# Patient Record
Sex: Male | Born: 1965 | Race: White | Hispanic: No | Marital: Married | Smoking: Current every day smoker
Health system: Southern US, Community
[De-identification: ages and names within clinical notes are randomized; demographics above are authoritative.]

## PROBLEM LIST (undated history)

## (undated) DIAGNOSIS — N200 Calculus of kidney: Secondary | ICD-10-CM

## (undated) DIAGNOSIS — E785 Hyperlipidemia, unspecified: Secondary | ICD-10-CM

## (undated) DIAGNOSIS — R5383 Other fatigue: Secondary | ICD-10-CM

## (undated) DIAGNOSIS — Z720 Tobacco use: Secondary | ICD-10-CM

## (undated) DIAGNOSIS — T7840XA Allergy, unspecified, initial encounter: Secondary | ICD-10-CM

## (undated) DIAGNOSIS — K08109 Complete loss of teeth, unspecified cause, unspecified class: Secondary | ICD-10-CM

## (undated) DIAGNOSIS — R300 Dysuria: Secondary | ICD-10-CM

## (undated) DIAGNOSIS — D649 Anemia, unspecified: Secondary | ICD-10-CM

## (undated) DIAGNOSIS — F419 Anxiety disorder, unspecified: Secondary | ICD-10-CM

## (undated) DIAGNOSIS — D72825 Bandemia: Secondary | ICD-10-CM

## (undated) DIAGNOSIS — R319 Hematuria, unspecified: Secondary | ICD-10-CM

## (undated) DIAGNOSIS — G44209 Tension-type headache, unspecified, not intractable: Secondary | ICD-10-CM

## (undated) DIAGNOSIS — Z8619 Personal history of other infectious and parasitic diseases: Secondary | ICD-10-CM

## (undated) DIAGNOSIS — R5381 Other malaise: Secondary | ICD-10-CM

## (undated) DIAGNOSIS — Z973 Presence of spectacles and contact lenses: Secondary | ICD-10-CM

## (undated) DIAGNOSIS — F329 Major depressive disorder, single episode, unspecified: Secondary | ICD-10-CM

## (undated) DIAGNOSIS — Z87442 Personal history of urinary calculi: Secondary | ICD-10-CM

## (undated) DIAGNOSIS — M5136 Other intervertebral disc degeneration, lumbar region: Secondary | ICD-10-CM

## (undated) DIAGNOSIS — I10 Essential (primary) hypertension: Secondary | ICD-10-CM

## (undated) DIAGNOSIS — C679 Malignant neoplasm of bladder, unspecified: Secondary | ICD-10-CM

## (undated) DIAGNOSIS — M51369 Other intervertebral disc degeneration, lumbar region without mention of lumbar back pain or lower extremity pain: Secondary | ICD-10-CM

## (undated) HISTORY — PX: KNEE SURGERY: SHX244

## (undated) HISTORY — DX: Personal history of other infectious and parasitic diseases: Z86.19

## (undated) HISTORY — DX: Allergy, unspecified, initial encounter: T78.40XA

## (undated) HISTORY — DX: Anemia, unspecified: D64.9

## (undated) HISTORY — DX: Anxiety disorder, unspecified: F41.9

## (undated) HISTORY — DX: Other fatigue: R53.83

## (undated) HISTORY — PX: VASECTOMY: SHX75

## (undated) HISTORY — DX: Tobacco use: Z72.0

## (undated) HISTORY — DX: Other malaise: R53.81

## (undated) HISTORY — DX: Calculus of kidney: N20.0

## (undated) HISTORY — PX: TYMPANOSTOMY TUBE PLACEMENT: SHX32

## (undated) HISTORY — DX: Hyperlipidemia, unspecified: E78.5

## (undated) HISTORY — DX: Major depressive disorder, single episode, unspecified: F32.9

## (undated) HISTORY — PX: TONSILLECTOMY: SUR1361

---
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
## 2004-11-17 ENCOUNTER — Ambulatory Visit: Payer: Self-pay | Admitting: Family Medicine

## 2005-10-07 ENCOUNTER — Emergency Department (HOSPITAL_COMMUNITY): Admission: EM | Admit: 2005-10-07 | Discharge: 2005-10-07 | Payer: Self-pay | Admitting: *Deleted

## 2006-09-14 ENCOUNTER — Ambulatory Visit: Payer: Self-pay | Admitting: Family Medicine

## 2007-11-14 ENCOUNTER — Telehealth: Payer: Self-pay | Admitting: Family Medicine

## 2007-12-13 ENCOUNTER — Ambulatory Visit: Payer: Self-pay | Admitting: Family Medicine

## 2007-12-13 LAB — CONVERTED CEMR LAB
Bilirubin Urine: NEGATIVE
Nitrite: NEGATIVE
Urobilinogen, UA: 0.2
WBC Urine, dipstick: NEGATIVE

## 2007-12-21 ENCOUNTER — Ambulatory Visit: Payer: Self-pay | Admitting: Family Medicine

## 2007-12-21 DIAGNOSIS — M129 Arthropathy, unspecified: Secondary | ICD-10-CM | POA: Insufficient documentation

## 2007-12-21 DIAGNOSIS — J309 Allergic rhinitis, unspecified: Secondary | ICD-10-CM | POA: Insufficient documentation

## 2007-12-21 LAB — CONVERTED CEMR LAB
ALT: 23 units/L (ref 0–53)
Albumin: 4.5 g/dL (ref 3.5–5.2)
Alkaline Phosphatase: 73 units/L (ref 39–117)
BUN: 19 mg/dL (ref 6–23)
CO2: 32 meq/L (ref 19–32)
Calcium: 10 mg/dL (ref 8.4–10.5)
Eosinophils Relative: 5.2 % — ABNORMAL HIGH (ref 0.0–5.0)
GFR calc Af Amer: 106 mL/min
Glucose, Bld: 93 mg/dL (ref 70–99)
HCT: 38.1 % — ABNORMAL LOW (ref 39.0–52.0)
Hemoglobin: 12.8 g/dL — ABNORMAL LOW (ref 13.0–17.0)
Lymphocytes Relative: 60.8 % — ABNORMAL HIGH (ref 12.0–46.0)
Monocytes Absolute: 0.5 10*3/uL (ref 0.1–1.0)
Monocytes Relative: 7.6 % (ref 3.0–12.0)
Neutro Abs: 1.8 10*3/uL (ref 1.4–7.7)
RBC: 4.12 M/uL — ABNORMAL LOW (ref 4.22–5.81)
Total CHOL/HDL Ratio: 3.5
Total Protein: 7.3 g/dL (ref 6.0–8.3)
WBC: 7 10*3/uL (ref 4.5–10.5)

## 2008-05-28 ENCOUNTER — Telehealth: Payer: Self-pay | Admitting: Family Medicine

## 2008-06-11 ENCOUNTER — Telehealth: Payer: Self-pay | Admitting: Family Medicine

## 2008-09-17 ENCOUNTER — Telehealth: Payer: Self-pay | Admitting: Family Medicine

## 2008-09-25 ENCOUNTER — Ambulatory Visit: Payer: Self-pay | Admitting: Family Medicine

## 2008-10-31 ENCOUNTER — Telehealth: Payer: Self-pay | Admitting: Family Medicine

## 2008-12-24 ENCOUNTER — Telehealth (INDEPENDENT_AMBULATORY_CARE_PROVIDER_SITE_OTHER): Payer: Self-pay | Admitting: *Deleted

## 2009-01-07 ENCOUNTER — Telehealth: Payer: Self-pay | Admitting: Family Medicine

## 2009-02-13 ENCOUNTER — Ambulatory Visit: Payer: Self-pay | Admitting: Family Medicine

## 2009-02-13 DIAGNOSIS — F419 Anxiety disorder, unspecified: Secondary | ICD-10-CM

## 2009-02-13 DIAGNOSIS — F329 Major depressive disorder, single episode, unspecified: Secondary | ICD-10-CM

## 2009-02-13 DIAGNOSIS — G43909 Migraine, unspecified, not intractable, without status migrainosus: Secondary | ICD-10-CM | POA: Insufficient documentation

## 2009-02-13 HISTORY — DX: Anxiety disorder, unspecified: F41.9

## 2009-09-10 ENCOUNTER — Ambulatory Visit: Payer: Self-pay | Admitting: Family Medicine

## 2009-09-10 DIAGNOSIS — G44209 Tension-type headache, unspecified, not intractable: Secondary | ICD-10-CM

## 2010-02-05 ENCOUNTER — Telehealth: Payer: Self-pay | Admitting: Family Medicine

## 2010-07-20 ENCOUNTER — Telehealth: Payer: Self-pay | Admitting: Family Medicine

## 2010-08-12 ENCOUNTER — Telehealth: Payer: Self-pay | Admitting: Family Medicine

## 2010-09-29 NOTE — Progress Notes (Signed)
Summary: refill alprazolam 30 day supply   Phone Note Call from Patient   Caller: Patient Call For: Judithann Sheen MD Summary of Call[facility] 30 days to be called in.  Cannot afford a 90 day.  Initial call taken by: Lynann Beaver CMA,  February 05, 2010 11:30 AM  Follow-up for Phone Call        ok per dr Alfonzo Feller  Follow-up by: Pura Spice, RN,  February 05, 2010 1:11 PM    New/Updated Medications: ALPRAZOLAM 0.5 MG  TABS (ALPRAZOLAM) three times a day as needed ANXIETY Prescriptions: ALPRAZOLAM 0.5 MG  TABS (ALPRAZOLAM) three times a day as needed ANXIETY  #90 x 4   Entered by:   Pura Spice, RN   Authorized by:   Judithann Sheen MD   Signed by:   Pura Spice, RN on 02/05/2010   Method used:   Telephoned to ...       [facility]  [place] [address] (retail)       [address]       [place], [place]  [address]       Ph: [phone] or [phone]       Fax: [phone]   RxID:   [id number]

## 2010-09-29 NOTE — Assessment & Plan Note (Signed)
Summary: FUP ON MEDS-CONGESTION//CCM   Vital Signs:  Patient profile:   45 year old male Weight:      147 pounds BMI:     21.79 O2 Sat:      98 % Temp:     98 degrees F Pulse rate:   100 / minute BP sitting:   134 / 82  (left arm)  Vitals Entered By: Pura Spice, RN (September 10, 2009 3:32 PM) CC: renew meds  Is Patient Diabetic? No Pain Assessment Patient in pain? no        History of Present Illness: This 45 year old white male was in to have his medications refilled for his medical problems. He has a problem with stress and tension resulting in some tension headaches of which have decreased in frequency. Patient relates he is trying to decrease his smoking and promises about [place] Fus comes in for his physical in 6 months they will quit. He relates his working hard and under stress Sleeps for 5 hours discussed taken one half alprazolam at 20 clock a.m. if needed relates he has not had a migraine headache in the past 6 months and does not need Relpax refill  Allergies (verified): No Known Drug Allergies  Past History:  Past Medical History: Last updated: 12/21/2007 Allergic rhinitis Headache  Past Surgical History: Last updated: 12/21/2007 Tonsillectomy  Risk Factors: Smoking Status: current (12/21/2007) Packs/Day: 1 (12/21/2007)  Review of Systems  The patient denies anorexia, fever, weight loss, weight gain, vision loss, decreased hearing, hoarseness, chest pain, syncope, dyspnea on exertion, peripheral edema, prolonged cough, headaches, hemoptysis, abdominal pain, melena, hematochezia, severe indigestion/heartburn, hematuria, incontinence, genital sores, muscle weakness, suspicious skin lesions, transient blindness, difficulty walking, depression, unusual weight change, abnormal bleeding, enlarged lymph nodes, angioedema, breast masses, and testicular masses.    Physical Exam  General:  Well-developed,well-nourished,in no acute distress; alert,appropriate and  cooperative throughout examination Lungs:  Normal respiratory effort, chest expands symmetrically. Lungs are clear to auscultation, no crackles or wheezes. Heart:  Normal rate and regular rhythm. S1 and S2 normal without gallop, murmur, click, rub or other extra sounds. Abdomen:  Bowel sounds positive,abdomen soft and non-tender without masses, organomegaly or hernias noted. Extremities:  No clubbing, cyanosis, edema, or deformity noted with normal full range of motion of all joints.     Impression & Recommendations:  Problem # 1:  ANXIETY (ICD-300.00) Assessment Improved  His updated medication list for this problem includes:    Alprazolam 0.5 Mg Tabs (Alprazolam) .Marland Kitchen... Three times a day as needed anxiety  Problem # 2:  MIGRAINE HEADACHE (ICD-346.90) Assessment: Improved  The following medications were removed from the medication list:    Diclofenac Sodium 75 Mg Tbec (Diclofenac sodium) .Marland Kitchen... 1 two times a day for arthritis His updated medication list for this problem includes:    Fioricet 50-325-40 Mg Tabs (Butalbital-apap-caffeine) .Marland Kitchen... Take 2 every 4hrs as needed headache.not to exceed 4 per day    Relpax 40 Mg Tabs (Eletriptan hydrobromide) .Marland Kitchen... 1 onset headach, repeat in 1-2 hrs if needed  Problem # 3:  ARTHRITIS (ICD-716.90) Assessment: Improved  Problem # 4:  HEADACHE, TENSION, CHRONIC (ICD-307.81) Assessment: Unchanged  The following medications were removed from the medication list:    Diclofenac Sodium 75 Mg Tbec (Diclofenac sodium) .Marland Kitchen... 1 two times a day for arthritis His updated medication list for this problem includes:    Fioricet 50-325-40 Mg Tabs (Butalbital-apap-caffeine) .Marland Kitchen... Take 2 every 4hrs as needed headache.not to exceed 4 per day  Relpax 40 Mg Tabs (Eletriptan hydrobromide) .Marland Kitchen... 1 onset headach, repeat in 1-2 hrs if needed  Complete Medication List: 1)  Alprazolam 0.5 Mg Tabs (Alprazolam) .... Three times a day as needed anxiety 2)  Fioricet  50-325-40 Mg Tabs (Butalbital-apap-caffeine) .... Take 2 every 4hrs as needed headache.not to exceed 4 per day 3)  Relpax 40 Mg Tabs (Eletriptan hydrobromide) .Marland Kitchen.. 1 onset headach, repeat in 1-2 hrs if needed  Patient Instructions: 1)  Refill medications 2)  Proud that you have almost stop smoking 3)  return 6 months for physical exam Prescriptions: FIORICET 50-325-40 MG TABS (BUTALBITAL-APAP-CAFFEINE) take 2 every 4hrs as needed headache.not to exceed 4 per day  #300 x 1   Entered and Authorized by:   Judithann Sheen MD   Signed by:   Judithann Sheen MD on 09/10/2009   Method used:   Print then Give to Patient   RxID:   [id number] ALPRAZOLAM 0.5 MG  TABS (ALPRAZOLAM) three times a day as needed ANXIETY  #270 x 1   Entered and Authorized by:   Judithann Sheen MD   Signed by:   Judithann Sheen MD on 09/10/2009   Method used:   Print then Give to Patient   RxID:   [id number]

## 2010-09-29 NOTE — Progress Notes (Signed)
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  Pt has appt with Dr Scotty Court in Jan. 2012.   Initial call taken by: Debbra Riding,  July 20, 2010 12:40 PM  Follow-up for Phone Call        Alprazolam was given on 11/6 with a refill so no more. Can have Fioricet with same sig but only 120 tabs til Dr Scotty Court returns Follow-up by: Danise Edge MD,  July 20, 2010 12:55 PM

## 2010-10-01 NOTE — Progress Notes (Signed)
Summary: refill  Phone Note Refill Request Call back at [phone] Message from:  Patient----triage vm  Refills Requested: Medication #1:  ALPRAZOLAM 0.5 MG  TABS three times a day as needed ANXIETY send to [facility]       phone---[phone]    appt has been made already.  Initial call taken by: Warnell Forester,  August 12, 2010 1:08 PM  Follow-up for Phone Call        Rx called to pharmacy Follow-up by: Alfred Levins, CMA,  August 18, 2010 3:46 PM    Prescriptions: ALPRAZOLAM 0.5 MG  TABS (ALPRAZOLAM) three times a day as needed ANXIETY  #90 x 0   Entered by:   Alfred Levins, CMA   Authorized by:   Judithann Sheen MD   Signed by:   Alfred Levins, CMA on 08/18/2010   Method used:   Telephoned to ...       [facility]  [address] (retail)       [address], [address]       [place], [place]  [address]       Ph: [phone]       Fax: [phone]   RxID:   [id number]

## 2010-10-21 ENCOUNTER — Other Ambulatory Visit: Payer: Self-pay | Admitting: Family Medicine

## 2010-11-03 ENCOUNTER — Encounter: Payer: Self-pay | Admitting: Family Medicine

## 2010-11-04 ENCOUNTER — Encounter: Payer: Self-pay | Admitting: Family Medicine

## 2010-11-04 ENCOUNTER — Telehealth: Payer: Self-pay | Admitting: Family Medicine

## 2010-11-04 ENCOUNTER — Ambulatory Visit (INDEPENDENT_AMBULATORY_CARE_PROVIDER_SITE_OTHER): Payer: Self-pay | Admitting: Family Medicine

## 2010-11-04 VITALS — BP 130/80 | Temp 98.3°F | Ht 69.0 in | Wt 151.0 lb

## 2010-11-04 DIAGNOSIS — F419 Anxiety disorder, unspecified: Secondary | ICD-10-CM

## 2010-11-04 DIAGNOSIS — R51 Headache: Secondary | ICD-10-CM

## 2010-11-04 DIAGNOSIS — R4589 Other symptoms and signs involving emotional state: Secondary | ICD-10-CM

## 2010-11-04 DIAGNOSIS — F341 Dysthymic disorder: Secondary | ICD-10-CM

## 2010-11-04 DIAGNOSIS — F439 Reaction to severe stress, unspecified: Secondary | ICD-10-CM

## 2010-11-04 MED ORDER — ELETRIPTAN HYDROBROMIDE 40 MG PO TABS: 40.0000 mg | ORAL_TABLET | ORAL | Status: DC | PRN

## 2010-11-04 MED ORDER — ALPRAZOLAM 0.5 MG PO TABS: 1.0000 mg | ORAL_TABLET | Freq: Every evening | ORAL | Status: DC | PRN

## 2010-11-04 MED ORDER — BUTALBITAL-APAP-CAFFEINE 50-325-40 MG PO TABS: 2.0000 | ORAL_TABLET | ORAL | Status: DC | PRN

## 2010-11-04 NOTE — Telephone Encounter (Signed)
Call returned by Dr. Scotty Court. He left a message for the pt

## 2010-11-04 NOTE — Telephone Encounter (Signed)
Triage vm------pt has a ? About his Alprazolam. Please return his call.

## 2010-11-05 ENCOUNTER — Ambulatory Visit: Payer: Self-pay | Admitting: Family Medicine

## 2010-11-05 NOTE — Telephone Encounter (Signed)
See note

## 2010-11-10 MED ORDER — ALPRAZOLAM 1 MG PO TABS: 1.0000 mg | ORAL_TABLET | Freq: Four times a day (QID) | ORAL | Status: DC

## 2010-11-11 NOTE — Progress Notes (Signed)
  Subjective:    Patient ID: Dustin Ruiz, male    DOB: 1966/02/15, 45 y.o.   MRN: [id number]  This 45 year old white married male 45 year-old artery was left home over the past year relates he has had a very traumatic year having lost his job, normal and lost his dad his daughter has moved out and all these problems is called him to have increased headache, anxiety and depression and considerable problem with insomnia irrigated into discuss these problems as well as have his medications refilled in addition to the 20 year old daughter he has 2 other children a son 78 and a daughter 66 She has no insurance at this time and would request that we do not do any lab studies today and a CBC as insurance will return for physical examination HPI    Review of Systemssee  History of present illness    Objective:   Physical Exam Patient is a well developed well-nourished male who appears depressed but in no distress physical Blood pressure normal heart lungs clear       Assessment & Plan:  The patient is depressed anxious and needs his medications refilled

## 2010-11-11 NOTE — Patient Instructions (Signed)
I agree you had of her distress past year and I will refill your medication Return for numbness or

## 2011-02-15 ENCOUNTER — Other Ambulatory Visit: Payer: Self-pay | Admitting: Family Medicine

## 2011-02-18 ENCOUNTER — Telehealth: Payer: Self-pay | Admitting: Family Medicine

## 2011-02-18 NOTE — Telephone Encounter (Signed)
Pts wife called re: pts stress headaches. Pls call back asap.

## 2011-02-19 ENCOUNTER — Other Ambulatory Visit: Payer: Self-pay

## 2011-02-19 MED ORDER — HYDROCODONE-ACETAMINOPHEN 10-325 MG PO TABS: 1.0000 | ORAL_TABLET | Freq: Four times a day (QID) | ORAL | Status: AC | PRN

## 2011-02-23 NOTE — Telephone Encounter (Signed)
Dr. Scotty Court called pt and discussed this matter.

## 2011-03-16 ENCOUNTER — Other Ambulatory Visit: Payer: Self-pay | Admitting: Family Medicine

## 2011-04-26 ENCOUNTER — Other Ambulatory Visit: Payer: Self-pay | Admitting: Family Medicine

## 2011-04-29 ENCOUNTER — Telehealth: Payer: Self-pay | Admitting: Family Medicine

## 2011-04-29 MED ORDER — ALPRAZOLAM 1 MG PO TABS: 1.0000 mg | ORAL_TABLET | Freq: Four times a day (QID) | ORAL | Status: DC

## 2011-04-29 NOTE — Telephone Encounter (Signed)
Wife called. Pt needs a refill on his alprazolam 1mg . Please call in to [facility].

## 2011-04-29 NOTE — Telephone Encounter (Signed)
Ok per Dr. Scotty Court to fill pt's alprazolam x 5 rf.

## 2011-05-31 ENCOUNTER — Other Ambulatory Visit: Payer: Self-pay | Admitting: Family Medicine

## 2011-06-02 ENCOUNTER — Other Ambulatory Visit: Payer: Self-pay | Admitting: Family Medicine

## 2011-06-02 NOTE — Telephone Encounter (Signed)
Ok per Dr. Scotty Court to fill medication x 1rf pt needs an office visit.

## 2011-06-02 NOTE — Telephone Encounter (Signed)
Pts spouse called and that pts butalbital-acetaminophen-caffeine (FIORICET, ESGIC) 50-325-40 MG per tablet does not have any refills remaining. [facility] [place] Pts pharmacy sent refill req on Monday, will no response.

## 2011-06-02 NOTE — Telephone Encounter (Signed)
Attempted to call pt to make aware that an appt is needed for future refills. ; the message on the phone stated that per pt's request the pt is not taking incoming phone call.

## 2011-06-17 NOTE — Telephone Encounter (Signed)
Pt is out of fioricet 50-325-40mg  [facility] [phone]

## 2011-06-18 ENCOUNTER — Other Ambulatory Visit: Payer: Self-pay | Admitting: Family Medicine

## 2011-06-21 ENCOUNTER — Other Ambulatory Visit: Payer: Self-pay | Admitting: Family Medicine

## 2011-06-21 NOTE — Telephone Encounter (Signed)
Pts spouse called and said that pt needs refill of butalbital-acetaminophen-caffeine (FIORICET, ESGIC) 50-325-40 MG per table to [facility] in [place] [phone]

## 2011-06-24 MED ORDER — BUTALBITAL-APAP-CAFFEINE 50-325-40 MG PO TABS: ORAL_TABLET | ORAL | Status: DC

## 2011-06-24 NOTE — Telephone Encounter (Signed)
rx called into pharmacy

## 2011-06-24 NOTE — Telephone Encounter (Signed)
Pls advise.  

## 2011-06-24 NOTE — Telephone Encounter (Signed)
May refill #30 with no additional refills.

## 2011-07-13 ENCOUNTER — Other Ambulatory Visit: Payer: Self-pay | Admitting: Family Medicine

## 2011-07-13 DIAGNOSIS — G43909 Migraine, unspecified, not intractable, without status migrainosus: Secondary | ICD-10-CM

## 2011-07-23 ENCOUNTER — Other Ambulatory Visit: Payer: Self-pay | Admitting: Family Medicine

## 2011-07-23 NOTE — Telephone Encounter (Signed)
Pt last seen 11/04/10. Pls advise.

## 2011-07-23 NOTE — Telephone Encounter (Signed)
rx sent into pharmacy

## 2011-07-23 NOTE — Telephone Encounter (Signed)
Refill #30 tablets only until he can establish with new primary.

## 2011-08-22 ENCOUNTER — Other Ambulatory Visit: Payer: Self-pay | Admitting: Family Medicine

## 2011-09-21 ENCOUNTER — Telehealth: Payer: Self-pay | Admitting: Family Medicine

## 2011-09-21 MED ORDER — ELETRIPTAN HYDROBROMIDE 40 MG PO TABS: ORAL_TABLET | ORAL | Status: DC

## 2011-09-21 NOTE — Telephone Encounter (Signed)
Made an appt to see Dr Abner Greenspan tomorrow to establish with her. He calls her today wanting something for a migraine. [place] office instructed patient to here to get a rx for his migraine. Please advise.

## 2011-09-21 NOTE — Telephone Encounter (Signed)
May refill Relpax once only

## 2011-09-21 NOTE — Telephone Encounter (Signed)
Rx sent to pharmacy   

## 2011-09-22 ENCOUNTER — Ambulatory Visit (INDEPENDENT_AMBULATORY_CARE_PROVIDER_SITE_OTHER): Payer: BC Managed Care – PPO | Admitting: Family Medicine

## 2011-09-22 ENCOUNTER — Encounter: Payer: Self-pay | Admitting: Family Medicine

## 2011-09-22 ENCOUNTER — Other Ambulatory Visit: Payer: Self-pay | Admitting: Family Medicine

## 2011-09-22 DIAGNOSIS — G4700 Insomnia, unspecified: Secondary | ICD-10-CM

## 2011-09-22 DIAGNOSIS — R51 Headache: Secondary | ICD-10-CM

## 2011-09-22 DIAGNOSIS — E785 Hyperlipidemia, unspecified: Secondary | ICD-10-CM

## 2011-09-22 DIAGNOSIS — Z8619 Personal history of other infectious and parasitic diseases: Secondary | ICD-10-CM | POA: Insufficient documentation

## 2011-09-22 DIAGNOSIS — Z0000 Encounter for general adult medical examination without abnormal findings: Secondary | ICD-10-CM

## 2011-09-22 DIAGNOSIS — G43909 Migraine, unspecified, not intractable, without status migrainosus: Secondary | ICD-10-CM

## 2011-09-22 DIAGNOSIS — D649 Anemia, unspecified: Secondary | ICD-10-CM

## 2011-09-22 DIAGNOSIS — T7840XA Allergy, unspecified, initial encounter: Secondary | ICD-10-CM

## 2011-09-22 DIAGNOSIS — N200 Calculus of kidney: Secondary | ICD-10-CM | POA: Insufficient documentation

## 2011-09-22 MED ORDER — ALPRAZOLAM 1 MG PO TABS: 1.0000 mg | ORAL_TABLET | Freq: Three times a day (TID) | ORAL | Status: DC | PRN

## 2011-09-22 MED ORDER — FLUTICASONE PROPIONATE 50 MCG/ACT NA SUSP: 2.0000 | Freq: Every day | NASAL | Status: DC

## 2011-09-22 MED ORDER — HYDROCODONE-ACETAMINOPHEN 10-325 MG PO TABS: 2.0000 | ORAL_TABLET | Freq: Three times a day (TID) | ORAL | Status: DC | PRN

## 2011-09-22 MED ORDER — BUTALBITAL-APAP-CAFFEINE 50-325-40 MG PO TABS: ORAL_TABLET | ORAL | Status: DC

## 2011-09-22 MED ORDER — CETIRIZINE HCL 10 MG PO TABS: 10.0000 mg | ORAL_TABLET | Freq: Every day | ORAL | Status: DC | PRN

## 2011-09-22 NOTE — Assessment & Plan Note (Addendum)
Uses Benadryl as needed encouraged to try adding Claritin daily during the bad patches

## 2011-09-22 NOTE — Patient Instructions (Signed)
Preventative Care for Adults, Male A healthy lifestyle and preventative care can promote health and wellness. Preventative health guidelines for men include the following key practices:  A routine yearly physical is a good way to check with your caregiver about your health and preventative screening. It is a chance to share any concerns and updates on your health, and to receive a thorough exam.   Visit your dentist for a routine exam and preventative care every 6 months. Brush your teeth twice a day and floss once a day. Good oral hygiene prevents tooth decay and gum disease.   The frequency of eye exams is based on your age, health, family medical history, use of contact lenses, and other factors. Follow your caregiver's recommendations for frequency of eye exams.   Eat a healthy diet. Foods like vegetables, fruits, whole grains, low-fat dairy products, and lean protein foods contain the nutrients you need without too many calories. Decrease your intake of foods high in solid fats, added sugars, and salt. Eat the right amount of calories for you.Get information about a proper diet from your caregiver, if necessary.   Regular physical exercise is one of the most important things you can do for your health. Most adults should get at least 150 minutes of moderate-intensity exercise (any activity that increases your heart rate and causes you to sweat) each week. In addition, most adults need muscle-strengthening exercises on 2 or more days a week.   Maintain a healthy weight. The body mass index (BMI) is a screening tool to identify possible weight problems. It provides an estimate of body fat based on height and weight. Your caregiver can help determine your BMI, and can help you achieve or maintain a healthy weight.For adults 20 years and older:   A BMI below 18.5 is considered underweight.   A BMI of 18.5 to 24.9 is normal.   A BMI of 25 to 29.9 is considered overweight.   A BMI of 30 and  above is considered obese.   Maintain normal blood lipids and cholesterol levels by exercising and minimizing your intake of saturated fat. Eat a balanced diet with plenty of fruit and vegetables. Blood tests for lipids and cholesterol should begin at age 20 and be repeated every 5 years. If your lipid or cholesterol levels are high, you are over 50, or you are a high risk for heart disease, you may need your cholesterol levels checked more frequently.Ongoing high lipid and cholesterol levels should be treated with medicines if diet and exercise are not effective.   If you smoke, find out from your caregiver how to quit. If you do not use tobacco, do not start.   If you choose to drink alcohol, do not exceed 2 drinks per day. One drink is considered to be 12 ounces (355 mL) of beer, 5 ounces (148 mL) of wine, or 1.5 ounces (44 mL) of liquor.   Avoid use of street drugs. Do not share needles with anyone. Ask for help if you need support or instructions about stopping the use of drugs.   High blood pressure causes heart disease and increases the risk of stroke. Your blood pressure should be checked at least every 1 to 2 years. Ongoing high blood pressure should be treated with medicines, if weight loss and exercise are not effective.   If you are 45 to 46 years old, ask your caregiver if you should take aspirin to prevent heart disease.   Diabetes screening involves taking a blood   sample to check your fasting blood sugar level. This should be done once every 3 years, after age 45, if you are within normal weight and without risk factors for diabetes. Testing should be considered at a younger age or be carried out more frequently if you are overweight and have at least 1 risk factor for diabetes.   Colorectal cancer can be detected and often prevented. Most routine colorectal cancer screening begins at the age of 50 and continues through age 75. However, your caregiver may recommend screening at an  earlier age if you have risk factors for colon cancer. On a yearly basis, your caregiver may provide home test kits to check for hidden blood in the stool. Use of a small camera at the end of a tube, to directly examine the colon (sigmoidoscopy or colonoscopy), can detect the earliest forms of colorectal cancer. Talk to your caregiver about this at age 50, when routine screening begins. Direct examination of the colon should be repeated every 5 to 10 years through age 75, unless early forms of pre-cancerous polyps or small growths are found.   Practice safe sex. Use condoms and avoid high-risk sexual practices to reduce the spread of sexually transmitted infections (STIs). STIs include gonorrhea, chlamydia, syphilis, trichomonas, herpes, HPV, and human immunodeficiency virus (HIV). Herpes, HIV, and HPV are viral illnesses that have no cure. They can result in disability, cancer, and death.   A one-time screening for abdominal aortic aneurysm (AAA) and surgical repair of large AAAs by sound wave imaging (ultrasonography) is recommended for ages 65 to 75 years who are current or former smokers.   Healthy men should no longer receive prostate-specific antigen (PSA) blood tests as part of routine cancer screening. Consult with your caregiver about prostate cancer screening.   Use sunscreen with skin protection factor (SPF) of 30 or more. Apply sunscreen liberally and repeatedly throughout the day. You should seek shade when your shadow is shorter than you. Protect yourself by wearing long sleeves, pants, a wide-brimmed hat, and sunglasses year round, whenever you are outdoors.   Once a month, do a whole body skin exam, using a mirror to look at the skin on your back. Notify your caregiver of new moles, moles that have irregular borders, moles that are larger than a pencil eraser, or moles that have changed in shape or color.   Stay current with required immunizations.   Influenza. You need a dose every  fall (or winter). The composition of the flu vaccine changes each year, so being vaccinated once is not enough.   Pneumococcal polysaccharide. You need 1 to 2 doses if you smoke cigarettes or if you have certain chronic medical conditions. You need 1 dose at age 65 (or older) if you have never been vaccinated.   Tetanus, diphtheria, pertussis (Tdap, Td). Get 1 dose of Tdap vaccine if you are younger than age 65 years, are over 65 and have contact with an infant, are a healthcare worker, or simply want to be protected from whooping cough. After that, you need a Td booster dose every 10 years. Consult your caregiver if you have not had at least 3 tetanus and diphtheria-containing shots sometime in your life or have a deep or dirty wound.   HPV. This vaccine is recommended for males 13 through 46 years of age. This vaccine may be given to men 22 through 46 years of age who have not completed the 3 dose series. It is recommended for men through age 26   who have sex with men or whose immune system is weakened because of HIV infection, other illness, or medications. The vaccine is given in 3 doses over 6 months.   Measles, mumps, rubella (MMR). You need at least 1 dose of MMR if you were born in 1957 or later. You may also need a 2nd dose.   Meningococcal. If you are age 19 to 21 years and a first-year college student living in a residence hall, or have one of several medical conditions, you need to get vaccinated against meningococcal disease. You may also need additional booster doses.   Zoster (shingles). If you are age 60 years or older, you should get this vaccine.   Varicella (chickenpox). If you have never had chickenpox or you were vaccinated but received only 1 dose, talk to your caregiver to find out if you need this vaccine.   Hepatitis A. You need this vaccine if you have a specific risk factor for hepatitis A virus infection, or you simply wish to be protected from this disease. The vaccine is  usually given as 2 doses, 6 to 18 months apart.   Hepatitis B. You need this vaccine if you have a specific risk factor for hepatitis B virus infection or you simply wish to be protected from this disease. The vaccine is given in 3 doses, usually over 6 months.  Preventative Service / Frequency Ages 19 to 39  Blood pressure check.** / Every 1 to 2 years.   Lipid and cholesterol check.**/ Every 5 years beginning at age 20.   Skin self-exam. / Monthly.   Influenza immunization.** / Every year.   Pneumococcal polysaccharide immunization.** / 1 to 2 doses if you smoke cigarettes or if you have certain chronic medical conditions.   Tetanus, diphtheria, pertussis (Tdap,Td) immunization. / A one-time dose of Tdap vaccine. After that, you need a Td booster dose every 10 years.   HPV immunization. / 3 doses over 6 months, if 26 and younger.   Measles, mumps, rubella (MMR) immunization. / You need at least 1 dose of MMR if you were born in 1957 or later. You may also need a 2nd dose.   Meningococcal immunization. / 1 dose if you are age 19 to 21 years and a first-year college student living in a residence hall, or have one of several medical conditions, you need to get vaccinated against meningococcal disease. You may also need additional booster doses.   Varicella immunization. **/ Consult your caregiver.   Hepatitis A immunization. ** / Consult your caregiver. 2 doses, 6 to 18 months apart.   Hepatitis B immunization.** / Consult your caregiver. 3 doses usually over 6 months.  Ages 40 to 64  Blood pressure check.** / Every 1 to 2 years.   Lipid and cholesterol check.**/ Every 5 years beginning at age 20.   Fecal occult blood test (FOBT) of stool. / Every year beginning at age 50 and continuing until age 75. You may not have to do this test if you get colonoscopy every 10 years.   Flexible sigmoidoscopy** or colonoscopy.** / Every 5 years for a flexible sigmoidoscopy or every 10 years for  a colonoscopy beginning at age 50 and continuing until age 75.   Skin self-exam. / Monthly.   Influenza immunization.** / Every year.   Pneumococcal polysaccharide immunization.** / 1 to 2 doses if you smoke cigarettes or if you have certain chronic medical conditions.   Tetanus, diphtheria, pertussis (Tdap/Td) immunization.** / A one-time dose of   Tdap vaccine. After that, you need a Td booster dose every 10 years.   Measles, mumps, rubella (MMR) immunization. / You need at least 1 dose of MMR if you were born in 1957 or later. You may also need a 2nd dose.   Varicella immunization. **/ Consult your caregiver.   Meningococcal immunization.** / Consult your caregiver.   Hepatitis A immunization. ** / Consult your caregiver. 2 doses, 6 to 18 months apart.   Hepatitis B immunization.** / Consult your caregiver. 3 doses, usually over 6 months.  Ages 56 and over  Blood pressure check.** / Every 1 to 2 years.   Lipid and cholesterol check.**/ Every 5 years beginning at age 63.   Fecal occult blood test (FOBT) of stool. / Every year beginning at age 49 and continuing until age 18. You may not have to do this test if you get colonoscopy every 10 years.   Flexible sigmoidoscopy** or colonoscopy.** / Every 5 years for a flexible sigmoidoscopy or every 10 years for a colonoscopy beginning at age 32 and continuing until age 71.   Abdominal aortic aneurysm (AAA) screening.** / A one-time screening for ages 69 to 72 years who are current or former smokers.   Skin self-exam. / Monthly.   Influenza immunization.** / Every year.   Pneumococcal polysaccharide immunization.** / 1 dose at age 93 (or older) if you have never been vaccinated.   Tetanus, diphtheria, pertussis (Tdap, Td) immunization. / A one-time dose of Tdap vaccine if you are over 65 and have contact with an infant, are a Research scientist (physical sciences), or simply want to be protected from whooping cough. After that, you need a Td booster dose  every 10 years.   Varicella immunization. **/ Consult your caregiver.   Meningococcal immunization.** / Consult your caregiver.   Hepatitis A immunization. ** / Consult your caregiver. 2 doses, 6 to 18 months apart.   Hepatitis B immunization.** / Check with your caregiver. 3 doses, usually over 6 months.  **Family history and personal history of risk and conditions may change your caregiver's recommendations. Document Released: 10/12/2001 Document Revised: 04/28/2011 Document Reviewed: 01/11/2011 [facility] Patient Information 2012 [place], [place].   64 oz of clear fluids, 8 hours of sleep, regular exercise, eat regular, minimize carbs, check websites for Relpax, Maxalt or Axert coupons or discounts

## 2011-09-23 ENCOUNTER — Other Ambulatory Visit: Payer: Self-pay

## 2011-09-23 LAB — CBC
MCH: 31.4 pg (ref 26.0–34.0)
Platelets: 320 10*3/uL (ref 150–400)
RBC: 4.36 MIL/uL (ref 4.22–5.81)
RDW: 13 % (ref 11.5–15.5)
WBC: 8.8 10*3/uL (ref 4.0–10.5)

## 2011-09-23 LAB — BASIC METABOLIC PANEL
CO2: 26 mEq/L (ref 19–32)
Calcium: 9.8 mg/dL (ref 8.4–10.5)
Chloride: 103 mEq/L (ref 96–112)
Creat: 0.95 mg/dL (ref 0.50–1.35)
Sodium: 141 mEq/L (ref 135–145)

## 2011-09-23 LAB — HEPATIC FUNCTION PANEL
AST: 16 U/L (ref 0–37)
Albumin: 5.1 g/dL (ref 3.5–5.2)
Alkaline Phosphatase: 61 U/L (ref 39–117)
Total Bilirubin: 0.4 mg/dL (ref 0.3–1.2)
Total Protein: 7.1 g/dL (ref 6.0–8.3)

## 2011-09-23 LAB — LIPID PANEL
HDL: 55 mg/dL (ref >39)
Total CHOL/HDL Ratio: 3.2 Ratio
Triglycerides: 50 mg/dL (ref <150)

## 2011-09-23 LAB — PHOSPHORUS: Phosphorus: 3.8 mg/dL (ref 2.3–4.6)

## 2011-09-23 NOTE — Telephone Encounter (Signed)
Opened on accident

## 2011-09-23 NOTE — Progress Notes (Signed)
Patient ID: Dustin Ruiz, male   DOB: 1966/06/03, 46 y.o.   MRN: [id number] Dustin Ruiz [id number] May 09, 1966 09/23/2011      Progress Note-Follow Up  Subjective  Chief Complaint  Chief Complaint  Patient presents with  . Establish Care    new patient  . Migraine    seasonal    HPI  Patient is a 46 year old Caucasian male who presented to establish care. His previous physicians practice for tired. He has a long history of headaches both tension and migraines. Reports the migraines previously used to be treated well with Fioricet but now this is not helping as much and a recent prescription for Norco helped more. They tend to come in waves and often are worse with quick changes in temperature as well as during the springtime when he has increased congestion. Has tried Imitrex in the past which did not help but Relpax did. 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 No other recent illness, fevers, chills, chest pain, palpitations, shortness of breath, GI or GU complaints noted.  Past Medical History  Diagnosis Date  . Migraine   . Allergy   . History of chicken pox   . Renal lithiasis     Past Surgical History  Procedure Date  . Tonsillectomy     adenoidectomy  . Tympanostomy tube placement     once    Family History  Problem Relation Age of Onset  . Cancer Father     leukemia  . Hypertension Father   . Heart attack Father   . Heart disease Father     MI  . Stroke Father   . Hypertension Maternal Grandfather   . Diabetes Maternal Grandfather     ?  Marland Kitchen Alzheimer's disease Paternal Grandmother   . Cancer Paternal Grandfather     lung/ smoker    History   Social History  . Marital Status: Married    Spouse Name: N/A    Number of Children: N/A  . Years of Education: N/A   Occupational History  . Not on file.   Social History Main Topics  . Smoking status: Current Everyday Smoker -- 1.0 packs/day    Types:  Cigarettes  . Smokeless tobacco: Never Used  . Alcohol Use: Yes     occasionally  . Drug Use: No  . Sexually Active: Yes   Other Topics Concern  . Not on file   Social History Narrative  . No narrative on file    No current outpatient prescriptions on file prior to visit.    No Known Allergies  Review of Systems  Review of Systems  Constitutional: Negative for fever and malaise/fatigue.  HENT: Negative for congestion.   Eyes: Positive for photophobia. Negative for discharge.  Respiratory: Negative for shortness of breath.   Cardiovascular: Negative for chest pain, palpitations and leg swelling.  Gastrointestinal: Negative for nausea, abdominal pain and diarrhea.  Genitourinary: Negative for dysuria.  Musculoskeletal: Negative for falls.  Skin: Negative for rash.  Neurological: Positive for headaches. Negative for loss of consciousness.  Endo/Heme/Allergies: Negative for polydipsia.  Psychiatric/Behavioral: Negative for depression and suicidal ideas. The patient is not nervous/anxious and does not have insomnia.     Objective  BP 142/83  Pulse 80  Temp(Src) 98.9 F (37.2 C) (Temporal)  Ht 5\' 9"  (1.753 m)  Wt 142 lb 12.8 oz (64.774 kg)  BMI 21.09 kg/m2  SpO2 98%  Physical Exam  Physical Exam  Constitutional: He is oriented to person, place, and time and well-developed, well-nourished, and in no distress. No distress.  HENT:  Head: Normocephalic and atraumatic.  Eyes: Conjunctivae are normal.  Neck: Neck supple. No thyromegaly present.  Cardiovascular: Normal rate, regular rhythm and normal heart sounds.   No murmur heard. Pulmonary/Chest: Effort normal and breath sounds normal. No respiratory distress.  Abdominal: He exhibits no distension and no mass. There is no tenderness.  Musculoskeletal: He exhibits no edema.  Neurological: He is alert and oriented to person, place, and time.  Skin: Skin is warm.  Psychiatric: Memory, affect and judgment normal.     Lab Results  Component Value Date   TSH 1.609 09/22/2011   Lab Results  Component Value Date   WBC 8.8 09/22/2011   HGB 13.7 09/22/2011   HCT 41.0 09/22/2011   MCV 94.0 09/22/2011   PLT 320 09/22/2011   Lab Results  Component Value Date   CREATININE 0.95 09/22/2011   BUN 15 09/22/2011   NA 141 09/22/2011   K 3.8 09/22/2011   CL 103 09/22/2011   CO2 26 09/22/2011   Lab Results  Component Value Date   ALT 11 09/22/2011   AST 16 09/22/2011   ALKPHOS 61 09/22/2011   BILITOT 0.4 09/22/2011   Lab Results  Component Value Date   CHOL 178 09/22/2011   Lab Results  Component Value Date   HDL 55 09/22/2011   Lab Results  Component Value Date   LDLCALC 113* 09/22/2011   Lab Results  Component Value Date   TRIG 50 09/22/2011   Lab Results  Component Value Date   CHOLHDL 3.2 09/22/2011     Assessment & Plan  Allergic state Uses Benadryl as needed encouraged to try adding Claritin daily during the bad patches  MIGRAINE HEADACHE Patient with a long history of migraines. 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 Relpax has helped in the past with Imitrex has not. Relpax is a possible inhibitor medication for him at this time. Encouraged small frequent meals, 64 ounces of fluids, 8 hours of sleep, regular exercise and avoidance of triggers. May try Maxalt when necessary is given refills on Norco and Fioricet but warned he may only use these sparingly but we will not be able to prescribe them. Does appear to have a seasonal allergy component to symptoms worsening and is encouraged to start a daily antihistamine  Renal lithiasis Distant history, maintain adequate hydration  Anemia Very mild in the past, resolved with today's blood draw  Hyperlipidemia Avoid trans fats, start MegaRed daily and increase fiber in diet

## 2011-09-26 ENCOUNTER — Encounter: Payer: Self-pay | Admitting: Family Medicine

## 2011-09-26 DIAGNOSIS — D649 Anemia, unspecified: Secondary | ICD-10-CM

## 2011-09-26 DIAGNOSIS — E785 Hyperlipidemia, unspecified: Secondary | ICD-10-CM

## 2011-09-26 HISTORY — DX: Hyperlipidemia, unspecified: E78.5

## 2011-09-26 HISTORY — DX: Anemia, unspecified: D64.9

## 2011-09-26 NOTE — Assessment & Plan Note (Signed)
Distant history, maintain adequate hydration

## 2011-09-26 NOTE — Assessment & Plan Note (Signed)
Patient with a long history of migraines. 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 Relpax has helped in the past with Imitrex has not. Relpax is a possible inhibitor medication for him at this time. Encouraged small frequent meals, 64 ounces of fluids, 8 hours of sleep, regular exercise and avoidance of triggers. May try Maxalt when necessary is given refills on Norco and Fioricet but warned he may only use these sparingly but we will not be able to prescribe them. Does appear to have a seasonal allergy component to symptoms worsening and is encouraged to start a daily antihistamine

## 2011-09-26 NOTE — Assessment & Plan Note (Signed)
Avoid trans fats, start MegaRed daily and increase fiber in diet

## 2011-09-26 NOTE — Assessment & Plan Note (Signed)
Very mild in the past, resolved with today's blood draw

## 2011-10-13 ENCOUNTER — Other Ambulatory Visit: Payer: Self-pay | Admitting: Family Medicine

## 2011-10-28 ENCOUNTER — Other Ambulatory Visit: Payer: Self-pay

## 2011-10-28 DIAGNOSIS — R51 Headache: Secondary | ICD-10-CM

## 2011-10-28 MED ORDER — BUTALBITAL-APAP-CAFFEINE 50-325-40 MG PO TABS: ORAL_TABLET | ORAL | Status: DC

## 2011-12-06 ENCOUNTER — Other Ambulatory Visit: Payer: Self-pay | Admitting: Family Medicine

## 2011-12-06 DIAGNOSIS — R51 Headache: Secondary | ICD-10-CM

## 2011-12-06 MED ORDER — BUTALBITAL-APAP-CAFFEINE 50-325-40 MG PO TABS: ORAL_TABLET | ORAL | Status: DC

## 2011-12-06 NOTE — Telephone Encounter (Signed)
Please advise refill?Last RX was wrote on 10-28-11 # 60 X 1 refill. RX sent to fax [phone]

## 2011-12-06 NOTE — Telephone Encounter (Signed)
RX faxed to pharmacy.

## 2011-12-13 ENCOUNTER — Other Ambulatory Visit: Payer: Self-pay

## 2011-12-13 DIAGNOSIS — G4700 Insomnia, unspecified: Secondary | ICD-10-CM

## 2011-12-13 MED ORDER — ALPRAZOLAM 1 MG PO TABS: 1.0000 mg | ORAL_TABLET | Freq: Three times a day (TID) | ORAL | Status: DC | PRN

## 2011-12-13 NOTE — Telephone Encounter (Signed)
Last RX wrote on 09-22-11 quantity #90 with 1 refill.  RX sent to pharmacy

## 2011-12-15 ENCOUNTER — Other Ambulatory Visit: Payer: Self-pay

## 2011-12-15 DIAGNOSIS — R51 Headache: Secondary | ICD-10-CM

## 2011-12-15 MED ORDER — HYDROCODONE-ACETAMINOPHEN 10-325 MG PO TABS: 2.0000 | ORAL_TABLET | Freq: Three times a day (TID) | ORAL | Status: DC | PRN

## 2011-12-15 NOTE — Telephone Encounter (Signed)
RX faxed to pharmacy.

## 2011-12-15 NOTE — Telephone Encounter (Signed)
Please advise? Last RX wrote on 09-22-10 quantity 60 with 2 refills. Fax # to pharmacy [phone]

## 2011-12-27 ENCOUNTER — Other Ambulatory Visit: Payer: Self-pay

## 2011-12-27 DIAGNOSIS — G4700 Insomnia, unspecified: Secondary | ICD-10-CM

## 2011-12-27 MED ORDER — ALPRAZOLAM 1 MG PO TABS: 1.0000 mg | ORAL_TABLET | Freq: Three times a day (TID) | ORAL | Status: DC | PRN

## 2011-12-27 NOTE — Telephone Encounter (Signed)
Faxed to pharmacy

## 2011-12-27 NOTE — Telephone Encounter (Signed)
Pt picked up a 30 day supply from [facility] on 12-13-11. I cancelled the refill. Pt needs this filled at [facility]. Please advise? Fax# [phone]

## 2012-01-03 ENCOUNTER — Other Ambulatory Visit: Payer: Self-pay

## 2012-01-03 DIAGNOSIS — R51 Headache: Secondary | ICD-10-CM

## 2012-01-03 MED ORDER — BUTALBITAL-APAP-CAFFEINE 50-325-40 MG PO TABS: ORAL_TABLET | ORAL | Status: DC

## 2012-02-04 ENCOUNTER — Telehealth: Payer: Self-pay | Admitting: Family Medicine

## 2012-02-04 NOTE — Telephone Encounter (Signed)
Medication was sent to pharmacy and called into phamacy

## 2012-02-29 ENCOUNTER — Other Ambulatory Visit: Payer: Self-pay

## 2012-02-29 DIAGNOSIS — G4700 Insomnia, unspecified: Secondary | ICD-10-CM

## 2012-02-29 MED ORDER — ALPRAZOLAM 1 MG PO TABS: 1.0000 mg | ORAL_TABLET | Freq: Three times a day (TID) | ORAL | Status: DC | PRN

## 2012-02-29 NOTE — Telephone Encounter (Signed)
Pts wife left a message stating pt needs a refill of his Xanax sent to [facility].  Please advise? Last RX wrote on 12-27-11 quantity 90 with 1 refill.

## 2012-02-29 NOTE — Telephone Encounter (Signed)
RX faxed

## 2012-03-08 ENCOUNTER — Other Ambulatory Visit: Payer: Self-pay | Admitting: Family Medicine

## 2012-03-08 DIAGNOSIS — R51 Headache: Secondary | ICD-10-CM

## 2012-03-08 MED ORDER — BUTALBITAL-APAP-CAFFEINE 50-325-40 MG PO TABS: ORAL_TABLET | ORAL | Status: DC

## 2012-03-08 NOTE — Progress Notes (Signed)
Patient in today accompanying wife to his appt, requests refill on Fioricet to treat headaches. Medication sent to his pharmacy today

## 2012-04-05 ENCOUNTER — Other Ambulatory Visit: Payer: Self-pay

## 2012-04-05 DIAGNOSIS — R51 Headache: Secondary | ICD-10-CM

## 2012-04-05 MED ORDER — HYDROCODONE-ACETAMINOPHEN 10-325 MG PO TABS: 2.0000 | ORAL_TABLET | Freq: Three times a day (TID) | ORAL | Status: DC | PRN

## 2012-04-05 NOTE — Telephone Encounter (Signed)
RX faxed and noted on script that pt needs appt for any additional refills

## 2012-04-05 NOTE — Telephone Encounter (Signed)
Please advise refill? We have seen the patient 1 time as a new pt on 09-22-11 and asked the patient to return on 11-20-11 and haven't seen him yet? Does pt need appt before refill? If not fax RX to [phone]

## 2012-04-05 NOTE — Telephone Encounter (Signed)
He can have 30 tabs with same sig til seen, just make sure he knows that any one on a controlled substance needs to come in at least every 6 months

## 2012-04-17 ENCOUNTER — Other Ambulatory Visit: Payer: Self-pay

## 2012-04-17 DIAGNOSIS — R51 Headache: Secondary | ICD-10-CM

## 2012-04-17 MED ORDER — BUTALBITAL-APAP-CAFFEINE 50-325-40 MG PO TABS: ORAL_TABLET | ORAL | Status: DC

## 2012-04-17 NOTE — Telephone Encounter (Signed)
We received an RX request for Butalbital. Per MD's notes beginning of August pt needs to be seen for an further refills. I will inform pharmacy. I left a message on patients cell phone to return my call.

## 2012-04-17 NOTE — Telephone Encounter (Signed)
Please advise butalbital refill? I informed pt that he needs to have an appt and he states he has one for 04-20-12. Pt would like to have 3 days worth sent to his pharmacy? Please advise?

## 2012-04-17 NOTE — Telephone Encounter (Signed)
I authorized pharmacy to dispense #6 fiorcet tabs, no RF.

## 2012-04-20 ENCOUNTER — Encounter: Payer: Self-pay | Admitting: Family Medicine

## 2012-04-20 ENCOUNTER — Ambulatory Visit (INDEPENDENT_AMBULATORY_CARE_PROVIDER_SITE_OTHER): Payer: BC Managed Care – PPO | Admitting: Family Medicine

## 2012-04-20 VITALS — BP 143/85 | HR 73 | Temp 97.9°F | Ht 69.0 in | Wt 145.8 lb

## 2012-04-20 DIAGNOSIS — Z720 Tobacco use: Secondary | ICD-10-CM

## 2012-04-20 DIAGNOSIS — G4700 Insomnia, unspecified: Secondary | ICD-10-CM

## 2012-04-20 DIAGNOSIS — F17200 Nicotine dependence, unspecified, uncomplicated: Secondary | ICD-10-CM

## 2012-04-20 DIAGNOSIS — R51 Headache: Secondary | ICD-10-CM

## 2012-04-20 DIAGNOSIS — F411 Generalized anxiety disorder: Secondary | ICD-10-CM

## 2012-04-20 DIAGNOSIS — T7840XA Allergy, unspecified, initial encounter: Secondary | ICD-10-CM

## 2012-04-20 DIAGNOSIS — G43909 Migraine, unspecified, not intractable, without status migrainosus: Secondary | ICD-10-CM

## 2012-04-20 DIAGNOSIS — R5383 Other fatigue: Secondary | ICD-10-CM

## 2012-04-20 MED ORDER — CITALOPRAM HYDROBROMIDE 10 MG PO TABS: 10.0000 mg | ORAL_TABLET | Freq: Every day | ORAL | Status: DC

## 2012-04-20 MED ORDER — ALPRAZOLAM 1 MG PO TABS: 1.0000 mg | ORAL_TABLET | Freq: Four times a day (QID) | ORAL | Status: DC | PRN

## 2012-04-20 MED ORDER — BUTALBITAL-APAP-CAFFEINE 50-325-40 MG PO TABS: ORAL_TABLET | ORAL | Status: DC

## 2012-04-20 MED ORDER — HYDROCODONE-ACETAMINOPHEN 10-325 MG PO TABS: 2.0000 | ORAL_TABLET | Freq: Three times a day (TID) | ORAL | Status: DC | PRN

## 2012-04-20 MED ORDER — RIZATRIPTAN BENZOATE 10 MG PO TBDP: 10.0000 mg | ORAL_TABLET | ORAL | Status: DC | PRN

## 2012-04-20 NOTE — Patient Instructions (Addendum)

## 2012-04-23 ENCOUNTER — Encounter: Payer: Self-pay | Admitting: Family Medicine

## 2012-04-23 DIAGNOSIS — Z720 Tobacco use: Secondary | ICD-10-CM

## 2012-04-23 HISTORY — DX: Tobacco use: Z72.0

## 2012-04-23 NOTE — Progress Notes (Signed)
Patient ID: Dustin Ruiz, male   DOB: [birth year], 46 y.o.   MRN: [id number] DIVIT STIPP [id number] 08/12/1966 04/23/2012      Progress Note-Follow Up  Subjective  Chief Complaint  Chief Complaint  Patient presents with  . Medication Refill    HPI  Patient is a 46 year old Caucasian male who is in today for followup. Unfortunately he began to smoke again although is not back to 3 packs per day. He's under great stress. Family and work stressors. Migraines have worsened. He is having daily headaches and frequent migrainous symptoms. Is taking increased medications. Also having a lot of anxiety and depression with palpitations and difficulty concentrating with irritability. Has used alprazolam 4 times a day instead of 3 infrequently as a result. No chest pain, shortness of breath, GI or GU complaints noted.  Past Medical History  Diagnosis Date  . Migraine   . Allergy   . History of chicken pox   . Renal lithiasis   . Anemia 09/26/2011  . Hyperlipidemia 09/26/2011  . Tobacco abuse 04/23/2012    Past Surgical History  Procedure Date  . Tonsillectomy     adenoidectomy  . Tympanostomy tube placement     once    Family History  Problem Relation Age of Onset  . Cancer Father     leukemia  . Hypertension Father   . Heart attack Father   . Heart disease Father     MI  . Stroke Father   . Hypertension Maternal Grandfather   . Diabetes Maternal Grandfather     ?  Marland Kitchen Alzheimer's disease Paternal Grandmother   . Cancer Paternal Grandfather     lung/ smoker    History   Social History  . Marital Status: Married    Spouse Name: N/A    Number of Children: N/A  . Years of Education: N/A   Occupational History  . Not on file.   Social History Main Topics  . Smoking status: Current Everyday Smoker -- 1.0 packs/day    Types: Cigarettes  . Smokeless tobacco: Never Used  . Alcohol Use: Yes     occasionally  . Drug Use: No  . Sexually Active: Yes   Other Topics  Concern  . Not on file   Social History Narrative  . No narrative on file    Current Outpatient Prescriptions on File Prior to Visit  Medication Sig Dispense Refill  . fluticasone (FLONASE) 50 MCG/ACT nasal spray Place 2 sprays into the nose daily.  16 g  6  . cetirizine (ZYRTEC) 10 MG tablet Take 1 tablet (10 mg total) by mouth daily as needed for allergies or rhinitis.  30 tablet  0  . citalopram (CELEXA) 10 MG tablet Take 1 tablet (10 mg total) by mouth daily.  30 tablet  2  . rizatriptan (MAXALT-MLT) 10 MG disintegrating tablet Take 1 tablet (10 mg total) by mouth as needed for migraine. May repeat in 2 hours if needed  10 tablet  1    No Known Allergies  Review of Systems  Review of Systems  Constitutional: Negative for fever and malaise/fatigue.  HENT: Negative for congestion.   Eyes: Negative for discharge.  Respiratory: Negative for shortness of breath.   Cardiovascular: Negative for chest pain, palpitations and leg swelling.  Gastrointestinal: Negative for nausea, abdominal pain and diarrhea.  Genitourinary: Negative for dysuria.  Musculoskeletal: Negative for falls.  Skin: Negative for rash.  Neurological: Positive for headaches. Negative for loss of consciousness.  Endo/Heme/Allergies: Negative for polydipsia.  Psychiatric/Behavioral: Positive for depression. Negative for suicidal ideas. The patient is nervous/anxious. The patient does not have insomnia.     Objective  BP 143/85  Pulse 73  Temp 97.9 F (36.6 C) (Temporal)  Ht 5\' 9"  (1.753 m)  Wt 145 lb 12.8 oz (66.134 kg)  BMI 21.53 kg/m2  SpO2 99%  Physical Exam  Physical Exam  Constitutional: He is oriented to person, place, and time and well-developed, well-nourished, and in no distress. No distress.  HENT:  Head: Normocephalic and atraumatic.  Eyes: Conjunctivae are normal.  Neck: Neck supple. No thyromegaly present.  Cardiovascular: Normal rate, regular rhythm and normal heart sounds.   No murmur  heard. Pulmonary/Chest: Effort normal and breath sounds normal. No respiratory distress.  Abdominal: He exhibits no distension and no mass. There is no tenderness.  Musculoskeletal: He exhibits no edema.  Neurological: He is alert and oriented to person, place, and time.  Skin: Skin is warm.  Psychiatric: Memory, affect and judgment normal.    Lab Results  Component Value Date   TSH 1.609 09/22/2011   Lab Results  Component Value Date   WBC 8.8 09/22/2011   HGB 13.7 09/22/2011   HCT 41.0 09/22/2011   MCV 94.0 09/22/2011   PLT 320 09/22/2011   Lab Results  Component Value Date   CREATININE 0.95 09/22/2011   BUN 15 09/22/2011   NA 141 09/22/2011   K 3.8 09/22/2011   CL 103 09/22/2011   CO2 26 09/22/2011   Lab Results  Component Value Date   ALT 11 09/22/2011   AST 16 09/22/2011   ALKPHOS 61 09/22/2011   BILITOT 0.4 09/22/2011   Lab Results  Component Value Date   CHOL 178 09/22/2011   Lab Results  Component Value Date   HDL 55 09/22/2011   Lab Results  Component Value Date   LDLCALC 113* 09/22/2011   Lab Results  Component Value Date   TRIG 50 09/22/2011   Lab Results  Component Value Date   CHOLHDL 3.2 09/22/2011     Assessment & Plan  Tobacco abuse Previous use of 3ppd, continues to recurrently smoke, encouraged complete cessation once again  MIGRAINE HEADACHE Has been under increased stress and agrees to start a new antidepressant to see if that helps his headaches. Will given rx for Maxalt ODT and see if that helps. May use bupap prn but discussed rebound headaches and the need to try and minimize use  ANXIETY Agrees to start Citalopram daily and may continue Alprazolam tid as well. He has recently increased his use and he is advised he can not routinely take qid dosing, so he will start the Citalopram and reasses next month.

## 2012-04-23 NOTE — Assessment & Plan Note (Addendum)
Has been under increased stress and agrees to start a new antidepressant to see if that helps his headaches. Will given rx for Maxalt ODT and see if that helps. May use bupap prn but discussed rebound headaches and the need to try and minimize use

## 2012-04-23 NOTE — Assessment & Plan Note (Signed)
Previous use of 3ppd, continues to recurrently smoke, encouraged complete cessation once again

## 2012-04-23 NOTE — Assessment & Plan Note (Signed)
Agrees to start Citalopram daily and may continue Alprazolam tid as well. He has recently increased his use and he is advised he can not routinely take qid dosing, so he will start the Citalopram and reasses next month.

## 2012-05-11 ENCOUNTER — Other Ambulatory Visit: Payer: Self-pay | Admitting: Family Medicine

## 2012-05-11 ENCOUNTER — Telehealth: Payer: Self-pay

## 2012-05-11 DIAGNOSIS — L578 Other skin changes due to chronic exposure to nonionizing radiation: Secondary | ICD-10-CM

## 2012-05-11 NOTE — Telephone Encounter (Signed)
pts wife called stating that MD was supposed to put in a referral for Dermatology to remove a red mole? Please advise?

## 2012-05-11 NOTE — Telephone Encounter (Signed)
Referral done for sun damaged skin

## 2012-05-12 NOTE — Telephone Encounter (Signed)
Left a message for Robin to return my call.   

## 2012-05-22 ENCOUNTER — Other Ambulatory Visit: Payer: Self-pay | Admitting: Family Medicine

## 2012-05-22 NOTE — Progress Notes (Signed)
Opened in error

## 2012-06-02 ENCOUNTER — Telehealth: Payer: Self-pay

## 2012-06-02 NOTE — Telephone Encounter (Signed)
Please advise? RX was wrote on 04-20-12 quantity 100 with 1 refill. Pt should not need a refill until 06-20-12. Pharmacy states RX was filled on 04-20-12 and 05-08-12? Pharmacy also needs RX to say the day limits.

## 2012-06-02 NOTE — Telephone Encounter (Signed)
OK so do me a favor, make sure he has a current drug contract, he is technically allowed 120 tabs in 30 daysbecause he can take 4 tabs daily. So do the math so he can have taken up to a 120 every 30 days and figure out when he should run out. On that date he can have a refill with #120 and no refills and that has to last him 30 days no question

## 2012-06-05 NOTE — Telephone Encounter (Signed)
Pt doesn't have a drug contract and 200 tab at 4 tabs daily should last 50 days. Pt shouldn't need another refill until Jun 08, 2012

## 2012-06-06 ENCOUNTER — Telehealth: Payer: Self-pay | Admitting: Family Medicine

## 2012-06-06 NOTE — Telephone Encounter (Signed)
LM for patient to CB to reschedule dermatology appt at [facility]

## 2012-06-08 ENCOUNTER — Other Ambulatory Visit: Payer: Self-pay

## 2012-06-08 DIAGNOSIS — R51 Headache: Secondary | ICD-10-CM

## 2012-06-08 MED ORDER — BUTALBITAL-APAP-CAFFEINE 50-325-40 MG PO TABS: ORAL_TABLET | ORAL | Status: DC

## 2012-06-16 ENCOUNTER — Other Ambulatory Visit: Payer: Self-pay

## 2012-06-16 DIAGNOSIS — G4700 Insomnia, unspecified: Secondary | ICD-10-CM

## 2012-06-16 MED ORDER — ALPRAZOLAM 1 MG PO TABS: 1.0000 mg | ORAL_TABLET | Freq: Four times a day (QID) | ORAL | Status: DC | PRN

## 2012-06-16 NOTE — Telephone Encounter (Signed)
Faxed to pharmacy

## 2012-06-16 NOTE — Telephone Encounter (Signed)
Please advise refill? Last RX wrote on 04-20-12 quantity 95 with 1 refill.  If ok to fax to [phone]

## 2012-07-06 ENCOUNTER — Other Ambulatory Visit: Payer: Self-pay

## 2012-07-06 DIAGNOSIS — R51 Headache: Secondary | ICD-10-CM

## 2012-07-06 NOTE — Telephone Encounter (Signed)
Please advise? Last RX wrote on 06-08-12 quantity 120 with 0 refills.  If ok fax to [phone]

## 2012-07-06 NOTE — Telephone Encounter (Signed)
OK to refill with same number and no refills

## 2012-07-07 MED ORDER — BUTALBITAL-APAP-CAFFEINE 50-325-40 MG PO TABS: ORAL_TABLET | ORAL | Status: DC

## 2012-07-07 NOTE — Telephone Encounter (Signed)
RX faxed

## 2012-07-11 ENCOUNTER — Telehealth: Payer: Self-pay

## 2012-07-11 NOTE — Telephone Encounter (Signed)
pts spouse informed. Pts spouse stated that she thought there was only 1 refill on it? I informed pt there was 3 refills

## 2012-07-11 NOTE — Telephone Encounter (Signed)
Pts spouse left a message stating that pt needs his Hydrocodone refilled. Last RX was wrote 04-20-12 quantity 60 with 3 refills. Pt shouldn't need refill until 08-20-12.  Per MD if pt is out of medication he needs to be seen to explain why he is out of medication?  Left a message for pt to return my call

## 2012-07-12 ENCOUNTER — Encounter: Payer: Self-pay | Admitting: Family Medicine

## 2012-07-12 ENCOUNTER — Ambulatory Visit (INDEPENDENT_AMBULATORY_CARE_PROVIDER_SITE_OTHER): Payer: BC Managed Care – PPO | Admitting: Family Medicine

## 2012-07-12 VITALS — BP 149/73 | HR 68 | Temp 97.4°F | Ht 69.0 in | Wt 150.8 lb

## 2012-07-12 DIAGNOSIS — R51 Headache: Secondary | ICD-10-CM

## 2012-07-12 DIAGNOSIS — M542 Cervicalgia: Secondary | ICD-10-CM

## 2012-07-12 DIAGNOSIS — M129 Arthropathy, unspecified: Secondary | ICD-10-CM

## 2012-07-12 DIAGNOSIS — G43909 Migraine, unspecified, not intractable, without status migrainosus: Secondary | ICD-10-CM

## 2012-07-12 MED ORDER — RIZATRIPTAN BENZOATE 10 MG PO TABS: 10.0000 mg | ORAL_TABLET | ORAL | Status: DC | PRN

## 2012-07-12 MED ORDER — HYDROCODONE-ACETAMINOPHEN 10-325 MG PO TABS: 2.0000 | ORAL_TABLET | Freq: Three times a day (TID) | ORAL | Status: DC | PRN

## 2012-07-12 MED ORDER — METHOCARBAMOL 500 MG PO TABS: 500.0000 mg | ORAL_TABLET | Freq: Three times a day (TID) | ORAL | Status: DC

## 2012-07-12 MED ORDER — RIZATRIPTAN BENZOATE 10 MG PO TBDP: 10.0000 mg | ORAL_TABLET | ORAL | Status: DC | PRN

## 2012-07-12 NOTE — Patient Instructions (Addendum)
Try restarting Cosamin DS and start a krill oil cap such MegaRed by [employer]   Wear and Tear Disorders of the Knee (Arthritis, Osteoarthritis) Everyone will experience wear and tear injuries (arthritis, osteoarthritis) of the knee. These are the changes we all get as we age. They come from the joint stress of daily living. The amount of cartilage damage in your knee and your symptoms determine if you need surgery. Mild problems require approximately two months recovery time. More severe problems take several months to recover. With mild problems, your surgeon may find worn and rough cartilage surfaces. With severe changes, your surgeon may find cartilage that has completely worn away and exposed the bone. Loose bodies of bone and cartilage, bone spurs (excess bone growth), and injuries to the menisci (cushions between the large bones of your leg) are also common. All of these problems can cause pain. For a mild wear and tear problem, rough cartilage may simply need to be shaved and smoothed. For more severe problems with areas of exposed bone, your surgeon may use an instrument for roughing up the bone surfaces to stimulate new cartilage growth. Loose bodies are usually removed. Torn menisci may be trimmed or repaired. ABOUT THE ARTHROSCOPIC PROCEDURE Arthroscopy is a surgical technique. It allows your orthopedic surgeon to diagnose and treat your knee injury with accuracy. The surgeon looks into your knee through a small scope. The scope is like a small (pencil-sized) telescope. Arthroscopy is less invasive than open knee surgery. You can expect a more rapid recovery. After the procedure, you will be moved to a recovery area until most of the effects of the medication have worn off. Your caregiver will discuss the test results with you. RECOVERY The severity of the arthritis and the type of procedure performed will determine recovery time. Other important factors include age, physical condition, medical  conditions, and the type of rehabilitation program. Strengthening your muscles after arthroscopy helps guarantee a better recovery. Follow your caregiver's instructions. Use crutches, rest, elevate, ice, and do knee exercises as instructed. Your caregivers will help you and instruct you with exercises and other physical therapy required to regain your mobility, muscle strength, and functioning following surgery. Only take over-the-counter or prescription medicines for pain, discomfort, or fever as directed by your caregiver.  SEEK MEDICAL CARE IF:   There is increased bleeding (more than a small spot) from the wound.  You notice redness, swelling, or increasing pain in the wound.  Pus is coming from wound.  You develop an unexplained oral temperature above 102 F (38.9 C) , or as your caregiver suggests.  You notice a foul smell coming from the wound or dressing.  You have severe pain with motion of the knee. SEEK IMMEDIATE MEDICAL CARE IF:   You develop a rash.  You have difficulty breathing.  You have any allergic problems. MAKE SURE YOU:   Understand these instructions.  Will watch your condition.  Will get help right away if you are not doing well or get worse. Document Released: 08/13/2000 Document Revised: 11/08/2011 Document Reviewed: 01/10/2008 [facility] Patient Information 2013 [place], [place].

## 2012-07-12 NOTE — Assessment & Plan Note (Signed)
May continue fioricet but acknowledges that Relpax has helped in the past. Cost was an issue, will try Maxalt prn
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
## 2012-07-12 NOTE — Assessment & Plan Note (Signed)
Encouraged Cosamin DS and MegaRed krill oil caps daily. Offered referral to ortho is going to wait and see what his insurance covers when his new policy year starts. Is allowed up to 3 Norco daily to use sparingly. For the neck spasm that happens more in cold weather will start Robaxin 500 mg bid

## 2012-08-04 ENCOUNTER — Other Ambulatory Visit: Payer: Self-pay

## 2012-08-04 DIAGNOSIS — R51 Headache: Secondary | ICD-10-CM

## 2012-08-04 MED ORDER — BUTALBITAL-APAP-CAFFEINE 50-325-40 MG PO TABS: ORAL_TABLET | ORAL | Status: DC

## 2012-08-04 NOTE — Telephone Encounter (Signed)
done

## 2012-08-04 NOTE — Telephone Encounter (Signed)
Faxed to patient

## 2012-08-04 NOTE — Telephone Encounter (Signed)
Please advise Butab refill? Last RX wrote on 07-07-12 quantity 120 with 0 refills.  If ok fax to [phone]

## 2012-08-10 ENCOUNTER — Other Ambulatory Visit: Payer: Self-pay

## 2012-08-10 DIAGNOSIS — G4700 Insomnia, unspecified: Secondary | ICD-10-CM

## 2012-08-10 NOTE — Telephone Encounter (Signed)
OK to refill on 08/14/12

## 2012-08-10 NOTE — Telephone Encounter (Signed)
Please advise? Last RX was 06-16-12 quantity 95 with 1 refill.  If ok fax to [phone]

## 2012-08-15 ENCOUNTER — Other Ambulatory Visit: Payer: Self-pay

## 2012-08-15 DIAGNOSIS — G4700 Insomnia, unspecified: Secondary | ICD-10-CM

## 2012-08-15 MED ORDER — ALPRAZOLAM 1 MG PO TABS: 1.0000 mg | ORAL_TABLET | Freq: Four times a day (QID) | ORAL | Status: DC | PRN

## 2012-08-15 NOTE — Telephone Encounter (Signed)
Per previous note ok to refill per MD on 08-14-12

## 2012-09-04 ENCOUNTER — Other Ambulatory Visit: Payer: Self-pay

## 2012-09-04 DIAGNOSIS — R51 Headache: Secondary | ICD-10-CM

## 2012-09-04 MED ORDER — BUTALBITAL-APAP-CAFFEINE 50-325-40 MG PO TABS: ORAL_TABLET | ORAL | Status: DC

## 2012-09-04 NOTE — Telephone Encounter (Signed)
RX faxed to pharmacy.

## 2012-09-04 NOTE — Telephone Encounter (Signed)
Please advise? Last RX was wrote on 08-04-12 quantity 120 with 0 refills.  If ok fax to [phone]

## 2012-09-07 ENCOUNTER — Other Ambulatory Visit: Payer: Self-pay

## 2012-09-07 DIAGNOSIS — R51 Headache: Secondary | ICD-10-CM

## 2012-09-07 MED ORDER — HYDROCODONE-ACETAMINOPHEN 10-325 MG PO TABS: 2.0000 | ORAL_TABLET | Freq: Three times a day (TID) | ORAL | Status: DC | PRN

## 2012-09-07 NOTE — Telephone Encounter (Signed)
Please advise refill? Last rx wrote on 07-12-12 quantity 90- with 1 refills.  If ok fax to [phone]

## 2012-10-03 ENCOUNTER — Other Ambulatory Visit: Payer: Self-pay

## 2012-10-03 DIAGNOSIS — R51 Headache: Secondary | ICD-10-CM

## 2012-10-03 MED ORDER — BUTALBITAL-APAP-CAFFEINE 50-325-40 MG PO TABS: ORAL_TABLET | ORAL | Status: DC

## 2012-10-03 NOTE — Telephone Encounter (Signed)
Please advise refill? Last RX wrote on 09-04-12 quantity 120 with 0 refills.  pts spouse stated on vm she would like this to go to [facility] in [place]. Is this ok since drug contract says [facility] in [place]?

## 2012-10-03 NOTE — Addendum Note (Signed)
Addended by: Court Joy on: 10/03/2012 01:08 PM   Modules accepted: Orders

## 2012-10-03 NOTE — Telephone Encounter (Signed)
Fioricet has to be called into [facility] they can't read the RX faxes. RX called in

## 2012-10-11 ENCOUNTER — Other Ambulatory Visit: Payer: Self-pay

## 2012-10-11 DIAGNOSIS — G4700 Insomnia, unspecified: Secondary | ICD-10-CM

## 2012-10-11 MED ORDER — ALPRAZOLAM 1 MG PO TABS: 1.0000 mg | ORAL_TABLET | Freq: Four times a day (QID) | ORAL | Status: DC | PRN

## 2012-10-11 NOTE — Telephone Encounter (Signed)
Please advise refill? Last RX wrote on 08-15-12 quantity 95 with 1 refill.  If ok fax to [phone]

## 2012-10-11 NOTE — Telephone Encounter (Signed)
Faxed RX 

## 2012-10-25 ENCOUNTER — Other Ambulatory Visit: Payer: Self-pay

## 2012-10-25 DIAGNOSIS — R51 Headache: Secondary | ICD-10-CM

## 2012-10-25 NOTE — Telephone Encounter (Signed)
Please advise Fioricet refill? Last RX was wrote on 10-03-12 quantity 120 with 0 refills   If ok fax to [phone]

## 2012-10-26 ENCOUNTER — Other Ambulatory Visit: Payer: Self-pay | Admitting: Family Medicine

## 2012-10-26 ENCOUNTER — Telehealth: Payer: Self-pay

## 2012-10-26 DIAGNOSIS — R51 Headache: Secondary | ICD-10-CM

## 2012-10-26 MED ORDER — BUTALBITAL-APAP-CAFFEINE 50-325-40 MG PO TABS: ORAL_TABLET | ORAL | Status: DC

## 2012-10-26 NOTE — Telephone Encounter (Signed)
RX sent. 1st one was a phone in

## 2012-10-30 ENCOUNTER — Other Ambulatory Visit: Payer: Self-pay | Admitting: Family Medicine

## 2012-10-30 DIAGNOSIS — R51 Headache: Secondary | ICD-10-CM

## 2012-10-30 MED ORDER — HYDROCODONE-ACETAMINOPHEN 10-325 MG PO TABS: 2.0000 | ORAL_TABLET | Freq: Three times a day (TID) | ORAL | Status: DC | PRN

## 2012-10-30 NOTE — Telephone Encounter (Signed)
Can have 45 tabs with same strength, same sig for Hydrocodone but neesds appt for more.

## 2012-10-30 NOTE — Telephone Encounter (Signed)
Please advise Hydrocodone refill? Last RX was on 09-07-12 quantity 90 with 1 refill.  We said pt needed appt before refills on last RX?  If ok fax to [phone]

## 2012-10-30 NOTE — Telephone Encounter (Signed)
RX sent and detailed message left on vm 

## 2012-10-30 NOTE — Telephone Encounter (Signed)
Patients wife called in requesting a refill of hydrocodone to be sent to [facility] in [place] on [address]. [phone].

## 2012-11-19 ENCOUNTER — Other Ambulatory Visit: Payer: Self-pay | Admitting: Family Medicine

## 2012-11-20 ENCOUNTER — Other Ambulatory Visit: Payer: Self-pay

## 2012-11-20 MED ORDER — BUTALBITAL-APAP-CAFFEINE 50-325-40 MG PO TABS: 2.0000 | ORAL_TABLET | ORAL | Status: DC | PRN

## 2012-11-20 NOTE — Telephone Encounter (Signed)
Please advise Fioricet refill? Last RX was wrote on 10-26-12 quantity 120 with 0 refills.  If ok fax to [phone]

## 2012-11-20 NOTE — Telephone Encounter (Signed)
RX faxed

## 2012-11-24 ENCOUNTER — Encounter: Payer: Self-pay | Admitting: Family Medicine

## 2012-11-24 ENCOUNTER — Ambulatory Visit (INDEPENDENT_AMBULATORY_CARE_PROVIDER_SITE_OTHER): Payer: BC Managed Care – PPO | Admitting: Family Medicine

## 2012-11-24 VITALS — BP 146/99 | HR 99 | Temp 98.6°F | Ht 69.0 in | Wt 157.8 lb

## 2012-11-24 DIAGNOSIS — M542 Cervicalgia: Secondary | ICD-10-CM

## 2012-11-24 DIAGNOSIS — Z5189 Encounter for other specified aftercare: Secondary | ICD-10-CM

## 2012-11-24 DIAGNOSIS — R51 Headache: Secondary | ICD-10-CM

## 2012-11-24 DIAGNOSIS — M129 Arthropathy, unspecified: Secondary | ICD-10-CM

## 2012-11-24 DIAGNOSIS — G4700 Insomnia, unspecified: Secondary | ICD-10-CM

## 2012-11-24 DIAGNOSIS — G43909 Migraine, unspecified, not intractable, without status migrainosus: Secondary | ICD-10-CM

## 2012-11-24 DIAGNOSIS — T7840XD Allergy, unspecified, subsequent encounter: Secondary | ICD-10-CM

## 2012-11-24 MED ORDER — RIZATRIPTAN BENZOATE 10 MG PO TBDP: 10.0000 mg | ORAL_TABLET | ORAL | Status: DC | PRN

## 2012-11-24 MED ORDER — METHOCARBAMOL 500 MG PO TABS: 500.0000 mg | ORAL_TABLET | Freq: Three times a day (TID) | ORAL | Status: DC

## 2012-11-24 MED ORDER — BUTALBITAL-APAP-CAFFEINE 50-325-40 MG PO TABS: 2.0000 | ORAL_TABLET | ORAL | Status: DC | PRN

## 2012-11-24 MED ORDER — MONTELUKAST SODIUM 10 MG PO TABS: 10.0000 mg | ORAL_TABLET | Freq: Every day | ORAL | Status: DC | PRN

## 2012-11-24 MED ORDER — ALPRAZOLAM 1 MG PO TABS: 1.0000 mg | ORAL_TABLET | Freq: Four times a day (QID) | ORAL | Status: DC | PRN

## 2012-11-24 MED ORDER — HYDROCODONE-ACETAMINOPHEN 10-325 MG PO TABS: 2.0000 | ORAL_TABLET | Freq: Three times a day (TID) | ORAL | Status: DC | PRN

## 2012-11-24 NOTE — Patient Instructions (Addendum)
Allergies, Generic  Allergies may happen from anything your body is sensitive to. This may be food, medicines, pollens, chemicals, and nearly anything around you in everyday life that produces allergens. An allergen is anything that causes an allergy producing substance. Heredity is often a factor in causing these problems. This means you may have some of the same allergies as your parents.  Food allergies happen in all age groups. Food allergies are some of the most severe and life threatening. Some common food allergies are cow's milk, seafood, eggs, nuts, wheat, and soybeans.  SYMPTOMS    Swelling around the mouth.   An itchy red rash or hives.   Vomiting or diarrhea.   Difficulty breathing.  SEVERE ALLERGIC REACTIONS ARE LIFE-THREATENING.  This reaction is called anaphylaxis. It can cause the mouth and throat to swell and cause difficulty with breathing and swallowing. In severe reactions only a trace amount of food (for example, peanut oil in a salad) may cause death within seconds.  Seasonal allergies occur in all age groups. These are seasonal because they usually occur during the same season every year. They may be a reaction to molds, grass pollens, or tree pollens. Other causes of problems are house dust mite allergens, pet dander, and mold spores. The symptoms often consist of nasal congestion, a runny itchy nose associated with sneezing, and tearing itchy eyes. There is often an associated itching of the mouth and ears. The problems happen when you come in contact with pollens and other allergens. Allergens are the particles in the air that the body reacts to with an allergic reaction. This causes you to release allergic antibodies. Through a chain of events, these eventually cause you to release histamine into the blood stream. Although it is meant to be protective to the body, it is this release that causes your discomfort. This is why you were given anti-histamines to feel better. If you are  unable to pinpoint the offending allergen, it may be determined by skin or blood testing. Allergies cannot be cured but can be controlled with medicine.  Hay fever is a collection of all or some of the seasonal allergy problems. It may often be treated with simple over-the-counter medicine such as diphenhydramine. Take medicine as directed. Do not drink alcohol or drive while taking this medicine. Check with your caregiver or package insert for child dosages.  If these medicines are not effective, there are many new medicines your caregiver can prescribe. Stronger medicine such as nasal spray, eye drops, and corticosteroids may be used if the first things you try do not work well. Other treatments such as immunotherapy or desensitizing injections can be used if all else fails. Follow up with your caregiver if problems continue. These seasonal allergies are usually not life threatening. They are generally more of a nuisance that can often be handled using medicine.  HOME CARE INSTRUCTIONS    If unsure what causes a reaction, keep a diary of foods eaten and symptoms that follow. Avoid foods that cause reactions.   If hives or rash are present:   Take medicine as directed.   You may use an over-the-counter antihistamine (diphenhydramine) for hives and itching as needed.   Apply cold compresses (cloths) to the skin or take baths in cool water. Avoid hot baths or showers. Heat will make a rash and itching worse.   If you are severely allergic:   Following a treatment for a severe reaction, hospitalization is often required for closer follow-up.     Wear a medic-alert bracelet or necklace stating the allergy.   You and your family must learn how to give adrenaline or use an anaphylaxis kit.   If you have had a severe reaction, always carry your anaphylaxis kit or EpiPen with you. Use this medicine as directed by your caregiver if a severe reaction is occurring. Failure to do so could have a fatal outcome.  SEEK  MEDICAL CARE IF:   You suspect a food allergy. Symptoms generally happen within 30 minutes of eating a food.   Your symptoms have not gone away within 2 days or are getting worse.   You develop new symptoms.   You want to retest yourself or your child with a food or drink you think causes an allergic reaction. Never do this if an anaphylactic reaction to that food or drink has happened before. Only do this under the care of a caregiver.  SEEK IMMEDIATE MEDICAL CARE IF:    You have difficulty breathing, are wheezing, or have a tight feeling in your chest or throat.   You have a swollen mouth, or you have hives, swelling, or itching all over your body.   You have had a severe reaction that has responded to your anaphylaxis kit or an EpiPen. These reactions may return when the medicine has worn off. These reactions should be considered life threatening.  MAKE SURE YOU:    Understand these instructions.   Will watch your condition.   Will get help right away if you are not doing well or get worse.  Document Released: 11/09/2002 Document Revised: 11/08/2011 Document Reviewed: 04/15/2008  [employer] Patient Information 2013 [employer].

## 2012-11-25 ENCOUNTER — Encounter: Payer: Self-pay | Admitting: Family Medicine

## 2012-11-25 NOTE — Assessment & Plan Note (Signed)
Struggling with left knee pain now and notes some recent numbness above the knee, has a chiropractor heis going to try some adjustments with but if symptoms worsen or do not improve may need further referral

## 2012-11-25 NOTE — Progress Notes (Signed)
Patient ID: Dustin Ruiz, male   DOB: 05/17/1966, 47 y.o.   MRN: [id number] Dustin Ruiz [id number] 03/19/1966 11/25/2012      Progress Note-Follow Up  Subjective  Chief Complaint  Chief Complaint  Patient presents with  . Sinusitis    allergies?- head congestion    HPI  This is a 47 year old male in today for routine followup. He has allergies and recently increased. He is having more facial pressure nasal congestion. Some popping and pressure in his ears is also noted. No fevers or chills. No significant rhinorrhea. Headaches are present but not notably worse. No chest pain, palpitations, shortness of breath, GI or GU complaints. His left knee is persistently painful and recently has developed a patch of numbness just above his anterior knee which is new. Denies worsening low back pain and no incontinence noted  Past Medical History  Diagnosis Date  . Migraine   . Allergy   . History of chicken pox   . Renal lithiasis   . Anemia 09/26/2011  . Hyperlipidemia 09/26/2011  . Tobacco abuse 04/23/2012    Past Surgical History  Procedure Laterality Date  . Tonsillectomy      adenoidectomy  . Tympanostomy tube placement      once  . Knee surgery      at age 2 for injury    Family History  Problem Relation Age of Onset  . Cancer Father     leukemia  . Hypertension Father   . Heart attack Father   . Heart disease Father     MI  . Stroke Father   . Hypertension Maternal Grandfather   . Diabetes Maternal Grandfather     ?  Marland Kitchen Alzheimer's disease Paternal Grandmother   . Cancer Paternal Grandfather     lung/ smoker    History   Social History  . Marital Status: Married    Spouse Name: N/A    Number of Children: N/A  . Years of Education: N/A   Occupational History  . Not on file.   Social History Main Topics  . Smoking status: Current Every Day Smoker -- 1.00 packs/day    Types: Cigarettes  . Smokeless tobacco: Never Used  . Alcohol Use: Yes     Comment:  occasionally  . Drug Use: No  . Sexually Active: Yes   Other Topics Concern  . Not on file   Social History Narrative  . No narrative on file    Current Outpatient Prescriptions on File Prior to Visit  Medication Sig Dispense Refill  . cetirizine (ZYRTEC) 10 MG tablet Take 1 tablet (10 mg total) by mouth daily as needed for allergies or rhinitis.  30 tablet  0  . citalopram (CELEXA) 10 MG tablet Take 1 tablet (10 mg total) by mouth daily.  30 tablet  2  . fluticasone (FLONASE) 50 MCG/ACT nasal spray Place 2 sprays into the nose daily.  16 g  6   No current facility-administered medications on file prior to visit.    No Known Allergies  Review of Systems  Review of Systems  Constitutional: Positive for malaise/fatigue. Negative for fever.  HENT: Positive for congestion.   Eyes: Negative for discharge.  Respiratory: Negative for shortness of breath.   Cardiovascular: Negative for chest pain, palpitations and leg swelling.  Gastrointestinal: Negative for nausea, abdominal pain and diarrhea.  Genitourinary: Negative for dysuria.  Musculoskeletal: Positive for joint pain. Negative for falls.  Skin: Negative for rash.  Neurological: Positive for  headaches. Negative for loss of consciousness.  Endo/Heme/Allergies: Negative for polydipsia.  Psychiatric/Behavioral: Negative for depression and suicidal ideas. The patient is not nervous/anxious and does not have insomnia.     Objective  BP 146/99  Pulse 99  Temp(Src) 98.6 F (37 C) (Temporal)  Ht 5\' 9"  (1.753 m)  Wt 157 lb 12.8 oz (71.578 kg)  BMI 23.29 kg/m2  SpO2 100%  Physical Exam  Physical Exam  Constitutional: He is oriented to person, place, and time and well-developed, well-nourished, and in no distress. No distress.  HENT:  Head: Normocephalic and atraumatic.  Eyes: Conjunctivae are normal.  Neck: Neck supple. No thyromegaly present.  Cardiovascular: Normal rate, regular rhythm and normal heart sounds.   No  murmur heard. Pulmonary/Chest: Effort normal and breath sounds normal. No respiratory distress.  Abdominal: He exhibits no distension and no mass. There is no tenderness.  Musculoskeletal: He exhibits no edema.  Neurological: He is alert and oriented to person, place, and time.  Skin: Skin is warm.  Psychiatric: Memory, affect and judgment normal.    Lab Results  Component Value Date   TSH 1.609 09/22/2011   Lab Results  Component Value Date   WBC 8.8 09/22/2011   HGB 13.7 09/22/2011   HCT 41.0 09/22/2011   MCV 94.0 09/22/2011   PLT 320 09/22/2011   Lab Results  Component Value Date   CREATININE 0.95 09/22/2011   BUN 15 09/22/2011   NA 141 09/22/2011   K 3.8 09/22/2011   CL 103 09/22/2011   CO2 26 09/22/2011   Lab Results  Component Value Date   ALT 11 09/22/2011   AST 16 09/22/2011   ALKPHOS 61 09/22/2011   BILITOT 0.4 09/22/2011   Lab Results  Component Value Date   CHOL 178 09/22/2011   Lab Results  Component Value Date   HDL 55 09/22/2011   Lab Results  Component Value Date   LDLCALC 113* 09/22/2011   Lab Results  Component Value Date   TRIG 50 09/22/2011   Lab Results  Component Value Date   CHOLHDL 3.2 09/22/2011     Assessment & Plan  MIGRAINE HEADACHE Tolerable with current meds. Refills provided today. Has found the Maxalt MLT helpful  ARTHRITIS Struggling with left knee pain now and notes some recent numbness above the knee, has a chiropractor heis going to try some adjustments with but if symptoms worsen or do not improve may need further referral  Allergic state Recently flared, may use Zyrtec once to twice daily, will try adding Singulair and nasal saline

## 2012-11-25 NOTE — Assessment & Plan Note (Signed)
Tolerable with current meds. Refills provided today. Has found the Maxalt MLT helpful

## 2012-11-25 NOTE — Assessment & Plan Note (Signed)
Recently flared, may use Zyrtec once to twice daily, will try adding Singulair and nasal saline

## 2012-11-28 ENCOUNTER — Ambulatory Visit: Payer: BC Managed Care – PPO | Admitting: Family Medicine

## 2012-12-01 ENCOUNTER — Ambulatory Visit: Payer: BC Managed Care – PPO | Admitting: Family Medicine

## 2013-01-21 ENCOUNTER — Other Ambulatory Visit: Payer: Self-pay | Admitting: Family Medicine

## 2013-01-23 ENCOUNTER — Other Ambulatory Visit: Payer: Self-pay | Admitting: Family Medicine

## 2013-01-23 NOTE — Telephone Encounter (Signed)
Please advise refill? Last RX was 11-24-12 quantity 45 with 2 refills  If ok fax to [phone]

## 2013-02-07 ENCOUNTER — Encounter: Payer: Self-pay | Admitting: Family Medicine

## 2013-02-07 ENCOUNTER — Ambulatory Visit (INDEPENDENT_AMBULATORY_CARE_PROVIDER_SITE_OTHER): Payer: BC Managed Care – PPO | Admitting: Family Medicine

## 2013-02-07 ENCOUNTER — Telehealth: Payer: Self-pay

## 2013-02-07 VITALS — BP 140/78 | HR 87 | Temp 98.0°F | Ht 69.0 in | Wt 154.1 lb

## 2013-02-07 DIAGNOSIS — R5381 Other malaise: Secondary | ICD-10-CM | POA: Insufficient documentation

## 2013-02-07 DIAGNOSIS — R52 Pain, unspecified: Secondary | ICD-10-CM

## 2013-02-07 DIAGNOSIS — T7840XD Allergy, unspecified, subsequent encounter: Secondary | ICD-10-CM

## 2013-02-07 DIAGNOSIS — F341 Dysthymic disorder: Secondary | ICD-10-CM

## 2013-02-07 DIAGNOSIS — F419 Anxiety disorder, unspecified: Secondary | ICD-10-CM

## 2013-02-07 DIAGNOSIS — Z5189 Encounter for other specified aftercare: Secondary | ICD-10-CM

## 2013-02-07 DIAGNOSIS — R030 Elevated blood-pressure reading, without diagnosis of hypertension: Secondary | ICD-10-CM

## 2013-02-07 DIAGNOSIS — Z720 Tobacco use: Secondary | ICD-10-CM

## 2013-02-07 DIAGNOSIS — G43909 Migraine, unspecified, not intractable, without status migrainosus: Secondary | ICD-10-CM

## 2013-02-07 DIAGNOSIS — F17200 Nicotine dependence, unspecified, uncomplicated: Secondary | ICD-10-CM

## 2013-02-07 DIAGNOSIS — M542 Cervicalgia: Secondary | ICD-10-CM

## 2013-02-07 DIAGNOSIS — R5383 Other fatigue: Secondary | ICD-10-CM

## 2013-02-07 DIAGNOSIS — E785 Hyperlipidemia, unspecified: Secondary | ICD-10-CM

## 2013-02-07 DIAGNOSIS — Z0000 Encounter for general adult medical examination without abnormal findings: Secondary | ICD-10-CM

## 2013-02-07 DIAGNOSIS — R51 Headache: Secondary | ICD-10-CM

## 2013-02-07 DIAGNOSIS — G4700 Insomnia, unspecified: Secondary | ICD-10-CM

## 2013-02-07 HISTORY — DX: Other malaise: R53.81

## 2013-02-07 LAB — CBC
MCHC: 33.4 g/dL (ref 30.0–36.0)
RDW: 13.7 % (ref 11.5–15.5)

## 2013-02-07 MED ORDER — HYDROCODONE-ACETAMINOPHEN 10-325 MG PO TABS: 1.0000 | ORAL_TABLET | Freq: Three times a day (TID) | ORAL | Status: DC | PRN

## 2013-02-07 MED ORDER — ELETRIPTAN HYDROBROMIDE 40 MG PO TABS: ORAL_TABLET | ORAL | Status: DC

## 2013-02-07 MED ORDER — METHOCARBAMOL 500 MG PO TABS: 500.0000 mg | ORAL_TABLET | Freq: Three times a day (TID) | ORAL | Status: DC

## 2013-02-07 MED ORDER — BUTALBITAL-APAP-CAFFEINE 50-325-40 MG PO TABS: 2.0000 | ORAL_TABLET | ORAL | Status: DC | PRN

## 2013-02-07 MED ORDER — ALPRAZOLAM 1 MG PO TABS: 1.0000 mg | ORAL_TABLET | Freq: Four times a day (QID) | ORAL | Status: DC | PRN

## 2013-02-07 NOTE — Assessment & Plan Note (Signed)
Repeat lipid panel prior to next visit

## 2013-02-07 NOTE — Telephone Encounter (Signed)
So the norco is only 3 tabs a day but the Fioricet should have a clarification on it. Max of 8 tabs of Fioricet and/or Norco combined to avoid maximum of 3000mg  in 24 hours.

## 2013-02-07 NOTE — Telephone Encounter (Signed)
[facility] pharmacy left a vm stating that pts Norco RX that was sent to them is over the tylenol amount as well as the Fioricet is also.  Please advise both of these medications?

## 2013-02-07 NOTE — Progress Notes (Signed)
Patient ID: Dustin Ruiz, male   DOB: Feb 10, 1966, 47 y.o.   MRN: [id number] Dustin Ruiz [id number] 13-Nov-1965 02/07/2013      Progress Note-Follow Up  Subjective  Chief Complaint  Chief Complaint  Patient presents with  . Follow-up    medication refills    HPI   Patient is a 47 yo [place] male in for followup. He comes in use to struggle with frequent migraines but is finding Maxalt less helpful. Would like to switch back to Relpax. Uses Urised when necessary as well. Continues to smoke about half pack per day. Denies any recent illness. No fevers, chest pain, palpitations, shortness of breath, GI or GU complaints noted at this time.  Past Medical History  Diagnosis Date  . Migraine   . Allergy   . History of chicken pox   . Renal lithiasis   . Anemia 09/26/2011  . Hyperlipidemia 09/26/2011  . Tobacco abuse 04/23/2012    Past Surgical History  Procedure Laterality Date  . Tonsillectomy      adenoidectomy  . Tympanostomy tube placement      once  . Knee surgery      at age 33 for injury    Family History  Problem Relation Age of Onset  . Cancer Father     leukemia  . Hypertension Father   . Heart attack Father   . Heart disease Father     MI  . Stroke Father   . Hypertension Maternal Grandfather   . Diabetes Maternal Grandfather     ?  Marland Kitchen Alzheimer's disease Paternal Grandmother   . Cancer Paternal Grandfather     lung/ smoker    History   Social History  . Marital Status: Married    Spouse Name: N/A    Number of Children: N/A  . Years of Education: N/A   Occupational History  . Not on file.   Social History Main Topics  . Smoking status: Current Every Day Smoker -- 1.00 packs/day    Types: Cigarettes  . Smokeless tobacco: Never Used  . Alcohol Use: Yes     Comment: occasionally  . Drug Use: No  . Sexually Active: Yes   Other Topics Concern  . Not on file   Social History Narrative  . No narrative on file    Current Outpatient  Prescriptions on File Prior to Visit  Medication Sig Dispense Refill  . citalopram (CELEXA) 10 MG tablet Take 1 tablet (10 mg total) by mouth daily.  30 tablet  2  . fluticasone (FLONASE) 50 MCG/ACT nasal spray Place 2 sprays into the nose daily.  16 g  6   No current facility-administered medications on file prior to visit.    No Known Allergies  Review of Systems ROS  Objective  BP 140/78  Pulse 87  Temp(Src) 98 F (36.7 C) (Oral)  Ht 5\' 9"  (1.753 m)  Wt 154 lb 1.9 oz (69.908 kg)  BMI 22.75 kg/m2  SpO2 98%  Physical Exam Physical Exam  Lab Results  Component Value Date   TSH 1.609 09/22/2011   Lab Results  Component Value Date   WBC 8.8 09/22/2011   HGB 13.7 09/22/2011   HCT 41.0 09/22/2011   MCV 94.0 09/22/2011   PLT 320 09/22/2011   Lab Results  Component Value Date   CREATININE 0.95 09/22/2011   BUN 15 09/22/2011   NA 141 09/22/2011   K 3.8 09/22/2011   CL 103 09/22/2011   CO2 26  09/22/2011   Lab Results  Component Value Date   ALT 11 09/22/2011   AST 16 09/22/2011   ALKPHOS 61 09/22/2011   BILITOT 0.4 09/22/2011   Lab Results  Component Value Date   CHOL 178 09/22/2011   Lab Results  Component Value Date   HDL 55 09/22/2011   Lab Results  Component Value Date   LDLCALC 113* 09/22/2011   Lab Results  Component Value Date   TRIG 50 09/22/2011   Lab Results  Component Value Date   CHOLHDL 3.2 09/22/2011     Assessment & Plan  Tobacco abuse Down to 1/2 ppd, encouraged complete cessation  MIGRAINE HEADACHE Not responding to Maxalt as well now so will switch back to Relpax which works better for him. Given rx for Fioricet.  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  Hyperlipidemia Repeat lipid panel prior to next visit.

## 2013-02-07 NOTE — Assessment & Plan Note (Signed)
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

## 2013-02-07 NOTE — Progress Notes (Signed)
Patient ID: Dustin Ruiz, male   DOB: Jan 16, 1966, 47 y.o.   MRN: [id number] BRAXTON VANTREASE [id number] 12/02/1965 02/07/2013      Progress Note-Follow Up  Subjective  Chief Complaint  Chief Complaint  Patient presents with  . Follow-up    medication refills    HPI Patient is a 47 yo [place] male in for followup. He comes in use to struggle with frequent migraines but is finding Maxalt less helpful. Would like to switch back to Relpax. Uses Urised when necessary as well. Continues to smoke about half pack per day. Denies any recent illness. No fevers, chest pain, palpitations, shortness of breath, GI or GU complaints noted at this time.  Past Medical History  Diagnosis Date  . Migraine   . Allergy   . History of chicken pox   . Renal lithiasis   . Anemia 09/26/2011  . Hyperlipidemia 09/26/2011  . Tobacco abuse 04/23/2012    Past Surgical History  Procedure Laterality Date  . Tonsillectomy      adenoidectomy  . Tympanostomy tube placement      once  . Knee surgery      at age 1 for injury    Family History  Problem Relation Age of Onset  . Cancer Father     leukemia  . Hypertension Father   . Heart attack Father   . Heart disease Father     MI  . Stroke Father   . Hypertension Maternal Grandfather   . Diabetes Maternal Grandfather     ?  Marland Kitchen Alzheimer's disease Paternal Grandmother   . Cancer Paternal Grandfather     lung/ smoker    History   Social History  . Marital Status: Married    Spouse Name: N/A    Number of Children: N/A  . Years of Education: N/A   Occupational History  . Not on file.   Social History Main Topics  . Smoking status: Current Every Day Smoker -- 1.00 packs/day    Types: Cigarettes  . Smokeless tobacco: Never Used  . Alcohol Use: Yes     Comment: occasionally  . Drug Use: No  . Sexually Active: Yes   Other Topics Concern  . Not on file   Social History Narrative  . No narrative on file    Current Outpatient  Prescriptions on File Prior to Visit  Medication Sig Dispense Refill  . citalopram (CELEXA) 10 MG tablet Take 1 tablet (10 mg total) by mouth daily.  30 tablet  2  . fluticasone (FLONASE) 50 MCG/ACT nasal spray Place 2 sprays into the nose daily.  16 g  6   No current facility-administered medications on file prior to visit.    No Known Allergies  Review of Systems  Review of Systems  Constitutional: Positive for malaise/fatigue. Negative for fever.  HENT: Negative for congestion.   Eyes: Negative for discharge.  Respiratory: Negative for shortness of breath.   Cardiovascular: Negative for chest pain, palpitations and leg swelling.  Gastrointestinal: Negative for nausea, abdominal pain and diarrhea.  Genitourinary: Negative for dysuria.  Musculoskeletal: Negative for falls.  Skin: Negative for rash.  Neurological: Negative for loss of consciousness and headaches.  Endo/Heme/Allergies: Negative for polydipsia.  Psychiatric/Behavioral: Negative for depression and suicidal ideas. The patient is not nervous/anxious and does not have insomnia.     Objective  BP 140/78  Pulse 87  Temp(Src) 98 F (36.7 C) (Oral)  Ht 5\' 9"  (1.753 m)  Wt 154 lb 1.9  oz (69.908 kg)  BMI 22.75 kg/m2  SpO2 98%  Physical Exam  Physical Exam  Constitutional: He is oriented to person, place, and time and well-developed, well-nourished, and in no distress. No distress.  HENT:  Head: Normocephalic and atraumatic.  Eyes: Conjunctivae are normal.  Neck: Neck supple. No thyromegaly present.  Cardiovascular: Normal rate, regular rhythm and normal heart sounds.   No murmur heard. Pulmonary/Chest: Effort normal and breath sounds normal. No respiratory distress.  Abdominal: He exhibits no distension and no mass. There is no tenderness.  Musculoskeletal: He exhibits no edema.  Neurological: He is alert and oriented to person, place, and time.  Skin: Skin is warm.  Psychiatric: Memory, affect and judgment  normal.    Lab Results  Component Value Date   TSH 1.609 09/22/2011   Lab Results  Component Value Date   WBC 8.8 09/22/2011   HGB 13.7 09/22/2011   HCT 41.0 09/22/2011   MCV 94.0 09/22/2011   PLT 320 09/22/2011   Lab Results  Component Value Date   CREATININE 0.95 09/22/2011   BUN 15 09/22/2011   NA 141 09/22/2011   K 3.8 09/22/2011   CL 103 09/22/2011   CO2 26 09/22/2011   Lab Results  Component Value Date   ALT 11 09/22/2011   AST 16 09/22/2011   ALKPHOS 61 09/22/2011   BILITOT 0.4 09/22/2011   Lab Results  Component Value Date   CHOL 178 09/22/2011   Lab Results  Component Value Date   HDL 55 09/22/2011   Lab Results  Component Value Date   LDLCALC 113* 09/22/2011   Lab Results  Component Value Date   TRIG 50 09/22/2011   Lab Results  Component Value Date   CHOLHDL 3.2 09/22/2011     Assessment & Plan  Tobacco abuse Down to 1/2 ppd, encouraged complete cessation  MIGRAINE HEADACHE Not responding to Maxalt as well now so will switch back to Relpax which works better for him. Given rx for Fioricet.  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  Hyperlipidemia Repeat lipid panel prior to next visit.

## 2013-02-07 NOTE — Patient Instructions (Addendum)
Next visit annual exam Migraine Headache A migraine headache is an intense, throbbing pain on one or both sides of your head. A migraine can last for 30 minutes to several hours. CAUSES  The exact cause of a migraine headache is not always known. However, a migraine may be caused when nerves in the brain become irritated and release chemicals that cause inflammation. This causes pain. SYMPTOMS  Pain on one or both sides of your head.  Pulsating or throbbing pain.  Severe pain that prevents daily activities.  Pain that is aggravated by any physical activity.  Nausea, vomiting, or both.  Dizziness.  Pain with exposure to bright lights, loud noises, or activity.  General sensitivity to bright lights, loud noises, or smells. Before you get a migraine, you may get warning signs that a migraine is coming (aura). An aura may include:  Seeing flashing lights.  Seeing bright spots, halos, or zig-zag lines.  Having tunnel vision or blurred vision.  Having feelings of numbness or tingling.  Having trouble talking.  Having muscle weakness. MIGRAINE TRIGGERS  Alcohol.  Smoking.  Stress.  Menstruation.  Aged cheeses.  Foods or drinks that contain nitrates, glutamate, aspartame, or tyramine.  Lack of sleep.  Chocolate.  Caffeine.  Hunger.  Physical exertion.  Fatigue.  Medicines used to treat chest pain (nitroglycerine), birth control pills, estrogen, and some blood pressure medicines. DIAGNOSIS  A migraine headache is often diagnosed based on:  Symptoms.  Physical examination.  A CT scan or MRI of your head. TREATMENT Medicines may be given for pain and nausea. Medicines can also be given to help prevent recurrent migraines.  HOME CARE INSTRUCTIONS  Only take over-the-counter or prescription medicines for pain or discomfort as directed by your caregiver. The use of long-term narcotics is not recommended.  Lie down in a dark, quiet room when you have a  migraine.  Keep a journal to find out what may trigger your migraine headaches. For example, write down:  What you eat and drink.  How much sleep you get.  Any change to your diet or medicines.  Limit alcohol consumption.  Quit smoking if you smoke.  Get 7 to 9 hours of sleep, or as recommended by your caregiver.  Limit stress.  Keep lights dim if bright lights bother you and make your migraines worse. SEEK IMMEDIATE MEDICAL CARE IF:   Your migraine becomes severe.  You have a fever.  You have a stiff neck.  You have vision loss.  You have muscular weakness or loss of muscle control.  You start losing your balance or have trouble walking.  You feel faint or pass out.  You have severe symptoms that are different from your first symptoms. MAKE SURE YOU:   Understand these instructions.  Will watch your condition.  Will get help right away if you are not doing well or get worse. Document Released: 08/16/2005 Document Revised: 11/08/2011 Document Reviewed: 08/06/2011 [facility] Patient Information 2014 [place], [place].

## 2013-02-07 NOTE — Assessment & Plan Note (Addendum)
Down to 1/2 ppd, encouraged complete cessation

## 2013-02-07 NOTE — Assessment & Plan Note (Addendum)
Not responding to Maxalt as well now so will switch back to Relpax which works better for him. Given rx for Fioricet.

## 2013-02-08 LAB — RENAL FUNCTION PANEL
Albumin: 4.9 g/dL (ref 3.5–5.2)
BUN: 22 mg/dL (ref 6–23)
CO2: 28 meq/L (ref 19–32)
Calcium: 9.8 mg/dL (ref 8.4–10.5)
Chloride: 102 meq/L (ref 96–112)
Creat: 1.01 mg/dL (ref 0.50–1.35)
Glucose, Bld: 81 mg/dL (ref 70–99)
Phosphorus: 3.5 mg/dL (ref 2.3–4.6)
Potassium: 4.6 meq/L (ref 3.5–5.3)
Sodium: 139 meq/L (ref 135–145)

## 2013-02-08 LAB — LIPID PANEL
Cholesterol: 165 mg/dL (ref 0–200)
HDL: 59 mg/dL (ref >39)
LDL Cholesterol: 87 mg/dL (ref 0–99)
Total CHOL/HDL Ratio: 2.8 ratio
Triglycerides: 94 mg/dL (ref <150)
VLDL: 19 mg/dL (ref 0–40)

## 2013-02-08 LAB — HEPATIC FUNCTION PANEL
Bilirubin, Direct: <0.1 mg/dL (ref 0.0–0.3)
Total Bilirubin: 0.2 mg/dL — ABNORMAL LOW (ref 0.3–1.2)

## 2013-02-08 LAB — TSH: TSH: 0.838 u[IU]/mL (ref 0.350–4.500)

## 2013-02-08 NOTE — Telephone Encounter (Signed)
Notified pharmacist and she will make notation on bottle.

## 2013-02-08 NOTE — Progress Notes (Signed)
Quick Note:  Patients spouse Informed and voiced understanding ______

## 2013-02-14 ENCOUNTER — Ambulatory Visit: Payer: BC Managed Care – PPO | Admitting: Family Medicine

## 2013-03-22 ENCOUNTER — Telehealth: Payer: Self-pay | Admitting: *Deleted

## 2013-03-22 NOTE — Telephone Encounter (Addendum)
Faxed refill request received from pharmacy for Norco 10-325 mg Last filled by MD on 06.11.14, #[id number]; per pharmacy pt filled on 06.11.14  & 07.06.14; still has one remaining refill per Cala Bradford, will fill for pt/SLS

## 2013-04-11 ENCOUNTER — Telehealth: Payer: Self-pay | Admitting: Family Medicine

## 2013-04-11 NOTE — Telephone Encounter (Signed)
OK to refill Hydrocodone same sig same number, no refills to [facility]

## 2013-04-11 NOTE — Telephone Encounter (Signed)
Patient is requesting a refill of hydrocodone to be sent to [facility] in [place]

## 2013-04-11 NOTE — Telephone Encounter (Signed)
Please advise refill?  If ok to fill fax to [phone]

## 2013-04-12 MED ORDER — HYDROCODONE-ACETAMINOPHEN 10-325 MG PO TABS: 1.0000 | ORAL_TABLET | Freq: Three times a day (TID) | ORAL | Status: DC | PRN

## 2013-04-12 NOTE — Telephone Encounter (Signed)
rx faxed

## 2013-04-19 ENCOUNTER — Telehealth: Payer: Self-pay

## 2013-04-19 NOTE — Telephone Encounter (Signed)
pts wife called stating pt is having anxiety and headaches would like to see dr Abner Greenspan  Appt scheduled for 04-23-13 at 2:15

## 2013-04-23 ENCOUNTER — Ambulatory Visit (INDEPENDENT_AMBULATORY_CARE_PROVIDER_SITE_OTHER): Payer: BC Managed Care – PPO | Admitting: Family Medicine

## 2013-04-23 ENCOUNTER — Encounter: Payer: Self-pay | Admitting: Family Medicine

## 2013-04-23 VITALS — BP 142/68 | HR 78 | Temp 98.3°F | Ht 69.0 in | Wt 141.1 lb

## 2013-04-23 DIAGNOSIS — M129 Arthropathy, unspecified: Secondary | ICD-10-CM

## 2013-04-23 DIAGNOSIS — R5381 Other malaise: Secondary | ICD-10-CM

## 2013-04-23 DIAGNOSIS — F418 Other specified anxiety disorders: Secondary | ICD-10-CM

## 2013-04-23 DIAGNOSIS — F329 Major depressive disorder, single episode, unspecified: Secondary | ICD-10-CM

## 2013-04-23 DIAGNOSIS — F17200 Nicotine dependence, unspecified, uncomplicated: Secondary | ICD-10-CM

## 2013-04-23 DIAGNOSIS — Z720 Tobacco use: Secondary | ICD-10-CM

## 2013-04-23 DIAGNOSIS — R52 Pain, unspecified: Secondary | ICD-10-CM

## 2013-04-23 DIAGNOSIS — F341 Dysthymic disorder: Secondary | ICD-10-CM

## 2013-04-23 DIAGNOSIS — M542 Cervicalgia: Secondary | ICD-10-CM

## 2013-04-23 DIAGNOSIS — G4700 Insomnia, unspecified: Secondary | ICD-10-CM

## 2013-04-23 DIAGNOSIS — R1013 Epigastric pain: Secondary | ICD-10-CM

## 2013-04-23 LAB — HEPATIC FUNCTION PANEL
ALT: 14 U/L (ref 0–53)
AST: 18 U/L (ref 0–37)
Alkaline Phosphatase: 74 U/L (ref 39–117)
Indirect Bilirubin: 0.3 mg/dL (ref 0.0–0.9)
Total Protein: 7.4 g/dL (ref 6.0–8.3)

## 2013-04-23 MED ORDER — ALPRAZOLAM 1 MG PO TABS: 1.0000 mg | ORAL_TABLET | Freq: Four times a day (QID) | ORAL | Status: AC | PRN

## 2013-04-23 MED ORDER — METHOCARBAMOL 500 MG PO TABS: 500.0000 mg | ORAL_TABLET | Freq: Three times a day (TID) | ORAL | Status: DC

## 2013-04-23 MED ORDER — HYDROCODONE-ACETAMINOPHEN 10-325 MG PO TABS: 1.0000 | ORAL_TABLET | Freq: Three times a day (TID) | ORAL | Status: DC | PRN

## 2013-04-23 MED ORDER — ESCITALOPRAM OXALATE 10 MG PO TABS: 10.0000 mg | ORAL_TABLET | Freq: Every day | ORAL | Status: DC

## 2013-04-23 NOTE — Patient Instructions (Addendum)
Start a probiotic such as Digestive Advantage or a generic option daily, eat a yogurt daily  Salon Pas cream or patch or Aspercreme  Degenerative Arthritis You have osteoarthritis. This is the wear and tear arthritis that comes with aging. It is also called degenerative arthritis. This is common in people past middle age. It is caused by stress on the joints. The large weight bearing joints of the lower extremities are most often affected. The knees, hips, back, neck, and hands can become painful, swollen, and stiff. This is the most common type of arthritis. It comes on with age, carrying too much weight, or from an injury. Treatment includes resting the sore joint until the pain and swelling improve. Crutches or a walker may be needed for severe flares. Only take over-the-counter or prescription medicines for pain, discomfort, or fever as directed by your caregiver. Local heat therapy may improve motion. Cortisone shots into the joint are sometimes used to reduce pain and swelling during flares. Osteoarthritis is usually not crippling and progresses slowly. There are things you can do to decrease pain:  Avoid high impact activities.  Exercise regularly.  Low impact exercises such as walking, biking and swimming help to keep the muscles strong and keep normal joint function.  Stretching helps to keep your range of motion.  Lose weight if you are overweight. This reduces joint stress. In severe cases when you have pain at rest or increasing disability, joint surgery may be helpful. See your caregiver for follow-up treatment as recommended.  SEEK IMMEDIATE MEDICAL CARE IF:   You have severe joint pain.  Marked swelling and redness in your joint develops.  You develop a high fever. Document Released: 08/16/2005 Document Revised: 11/08/2011 Document Reviewed: 01/16/2007 [facility] Patient Information 2014 [place], [place].

## 2013-04-24 LAB — H. PYLORI ANTIBODY, IGG: H Pylori IgG: 0.46 {ISR}

## 2013-04-29 ENCOUNTER — Encounter: Payer: Self-pay | Admitting: Family Medicine

## 2013-04-29 NOTE — Assessment & Plan Note (Signed)
Patient stopped Citalopram as he did not feel it helped. Declines daily med at this time he is encouraged to consider a different med if symptoms worsen. May use alprazolam sparingly

## 2013-04-29 NOTE — Assessment & Plan Note (Signed)
Diffuse pain, knee, ankle and wrist, given wrist splint today to use qhs and prn, ice and Aspercreme

## 2013-04-29 NOTE — Assessment & Plan Note (Signed)
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 He is more fatigued as a result, he is encouraged to try to get 7 hours a day of sleep. He is non commital

## 2013-04-29 NOTE — Assessment & Plan Note (Signed)
Unfortunately continues to smoke and has increased back up from 1/2 ppd to 1.5 ppd. He agrees to try to cut back again as he attempts to quit.

## 2013-04-29 NOTE — Progress Notes (Signed)
Patient ID: Dustin Ruiz, male   DOB: 1966-06-10, 47 y.o.   MRN: [id number] Dustin Ruiz [id number] [birth year] 04/29/2013      Progress Note-Follow Up  Subjective  Chief Complaint  Chief Complaint  Patient presents with  . Anxiety  . Headache    HPI  Patient is a 47 year old Caucasian male in today for followup. He is under increased stress unfortunately is smoking more. He has gone back to her chest is sleeping poorly. Has significant fatigue. Denies suicidal ideation but allergies low mood and anxiety attacks intermittently.. Citalopram and she did not think it was helpful. Is complaining of diffuse pain. Has left knee and ankle pain which is ongoing but has had some recent increased left wrist pain as well. Denies tinnitus. Or shortness of breath. No GI or GU complaints. He is struggling with HA but they are stable  Past Medical History  Diagnosis Date  . Migraine   . Allergy   . History of chicken pox   . Renal lithiasis   . Anemia 09/26/2011  . Hyperlipidemia 09/26/2011  . Tobacco abuse 04/23/2012  . Other malaise and fatigue 02/07/2013  . Anxiety and depression 02/13/2009    Qualifier: Diagnosis of  By: Alphonzo Severance MD, Loni Dolly     Past Surgical History  Procedure Laterality Date  . Tonsillectomy      adenoidectomy  . Tympanostomy tube placement      once  . Knee surgery      at age 34 for injury    Family History  Problem Relation Age of Onset  . Cancer Father     leukemia  . Hypertension Father   . Heart attack Father   . Heart disease Father     MI  . Stroke Father   . Hypertension Maternal Grandfather   . Diabetes Maternal Grandfather     ?  Marland Kitchen Alzheimer's disease Paternal Grandmother   . Cancer Paternal Grandfather     lung/ smoker    History   Social History  . Marital Status: Married    Spouse Name: N/A    Number of Children: N/A  . Years of Education: N/A   Occupational History  . Not on file.   Social History Main Topics  . Smoking  status: Current Every Day Smoker -- 1.00 packs/day    Types: Cigarettes  . Smokeless tobacco: Never Used  . Alcohol Use: Yes     Comment: occasionally  . Drug Use: No  . Sexual Activity: Yes   Other Topics Concern  . Not on file   Social History Narrative  . No narrative on file    Current Outpatient Prescriptions on File Prior to Visit  Medication Sig Dispense Refill  . butalbital-acetaminophen-caffeine (FIORICET, ESGIC) 50-325-40 MG per tablet Take 2 tablets by mouth every 4 (four) hours as needed for pain or headache. Take no more than 8 tablets combined per day of fioricet and hydrocodone.      . eletriptan (RELPAX) 40 MG tablet One tablet by mouth at onset of headache. May repeat in 2 hours if headache persists or recurs. May take max 2 daily  10 tablet  5  . fluticasone (FLONASE) 50 MCG/ACT nasal spray Place 2 sprays into the nose daily.  16 g  6   No current facility-administered medications on file prior to visit.    No Known Allergies  Review of Systems  Review of Systems  Constitutional: Positive for malaise/fatigue. Negative for fever.  HENT:  Negative for congestion.   Eyes: Negative for discharge.  Respiratory: Negative for shortness of breath.   Cardiovascular: Negative for chest pain, palpitations and leg swelling.  Gastrointestinal: Negative for nausea, abdominal pain and diarrhea.  Genitourinary: Negative for dysuria.  Musculoskeletal: Positive for joint pain. Negative for falls.  Skin: Negative for rash.  Neurological: Negative for loss of consciousness and headaches.  Endo/Heme/Allergies: Negative for polydipsia.  Psychiatric/Behavioral: Positive for depression. Negative for suicidal ideas. The patient is nervous/anxious and has insomnia.     Objective  BP 142/68  Pulse 78  Temp(Src) 98.3 F (36.8 C) (Oral)  Ht 5\' 9"  (1.753 m)  Wt 141 lb 1.9 oz (64.012 kg)  BMI 20.83 kg/m2  SpO2 99%  Physical Exam  Physical Exam  Constitutional: He is  oriented to person, place, and time and well-developed, well-nourished, and in no distress. No distress.  HENT:  Head: Normocephalic and atraumatic.  Eyes: Conjunctivae are normal.  Neck: Neck supple. No thyromegaly present.  Cardiovascular: Normal rate, regular rhythm and normal heart sounds.   No murmur heard. Pulmonary/Chest: Effort normal and breath sounds normal. No respiratory distress.  Abdominal: He exhibits no distension and no mass. There is no tenderness.  Musculoskeletal: He exhibits no edema.  Neurological: He is alert and oriented to person, place, and time.  Skin: Skin is warm.  Psychiatric: Memory, affect and judgment normal.    Lab Results  Component Value Date   TSH 0.838 02/07/2013   Lab Results  Component Value Date   WBC 8.3 02/07/2013   HGB 13.3 02/07/2013   HCT 39.8 02/07/2013   MCV 91.7 02/07/2013   PLT 321 02/07/2013   Lab Results  Component Value Date   CREATININE 1.01 02/07/2013   BUN 22 02/07/2013   NA 139 02/07/2013   K 4.6 02/07/2013   CL 102 02/07/2013   CO2 28 02/07/2013   Lab Results  Component Value Date   ALT 14 04/23/2013   AST 18 04/23/2013   ALKPHOS 74 04/23/2013   BILITOT 0.4 04/23/2013   Lab Results  Component Value Date   CHOL 165 02/07/2013   Lab Results  Component Value Date   HDL 59 02/07/2013   Lab Results  Component Value Date   LDLCALC 87 02/07/2013   Lab Results  Component Value Date   TRIG 94 02/07/2013   Lab Results  Component Value Date   CHOLHDL 2.8 02/07/2013     Assessment & Plan  Tobacco abuse Unfortunately continues to smoke and has increased back up from 1/2 ppd to 1.5 ppd. He agrees to try to cut back again as he attempts to quit.   Anxiety and depression Patient stopped Citalopram as he did not feel it helped. Declines daily med at this time he is encouraged to consider a different med if symptoms worsen. 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 He is more  fatigued as a result, he is encouraged to try to get 7 hours a day of sleep. He is non commital  ARTHRITIS Diffuse pain, knee, ankle and wrist, given wrist splint today to use qhs and prn, ice and Aspercreme

## 2013-05-02 ENCOUNTER — Other Ambulatory Visit: Payer: Self-pay

## 2013-05-02 ENCOUNTER — Telehealth: Payer: Self-pay

## 2013-05-02 NOTE — Telephone Encounter (Signed)
Can have rx when due

## 2013-05-02 NOTE — Telephone Encounter (Signed)
Sorry but I cannot fill meds early

## 2013-05-02 NOTE — Telephone Encounter (Signed)
Patients wife left a message stating that patient worked a 12 hour shift and he is very upset that Dr Abner Greenspan told him that she would refill his Fiorecet on the 29th and its way past that date?  Please advise refill? Last RX was done on 02-07-13 quantity 120 with 2 refills? It looks like 05-10-13 would be when this is due?

## 2013-05-02 NOTE — Telephone Encounter (Signed)
Patients spouse has been informed and has been informed per MD if patient has anymore questions patient will need to call us. Patients spouse voiced understanding

## 2013-05-02 NOTE — Telephone Encounter (Signed)
FYI:  Patient called stating that he didn't understand why MD won't fill his Fiorcet? Pt stated that MD told him at last appointment that she would fill it on the 29th.  Jasmine December called [facility] yesterday and they told her that pt filled the RX's for Fiorcet on 02-07-13 (quantity 120), again on 02-27-13 (quantity 120) the last refill on 03-27-13 (quantity 120) previous notes state that the quantity of 120 is to last 30 days.   I called patient back and informed him of this and he states that he doesn't understand is going to have to get his old bottles because he wouldn't of got a refill so close together. Pt stated he would call the pharmacy because maybe they got this mixed up with his wifes RX's.  I called and talked to the pharmacy and they stated that spouse Yacoub Diltz picked all three of these RX's up. Pharmacy stated that they have never seen Inez ever pick up any medication.

## 2013-05-02 NOTE — Telephone Encounter (Signed)
So I see. What he did is coming when he last picked up his prescription on July 29. I informed him that that was a 30 day supply and so he could not pick up his next dose until August 29. I did not do is go back and recounts [place] prescriptions he is having when he last picked him up to realize he wanted his medications early. He is well aware that each prescription is a 30 day supply unless we discussed otherwise. He can have a refill on that date he is due September 11 and no refills included with that. From now on I will only give him a one-month supply each time so that I do not have to continually didn't have to see that he is taking medications early. He is more than welcome to proceed to prescriptions elsewhere  And I can stop writing them

## 2013-05-02 NOTE — Telephone Encounter (Signed)
[facility] has been informed as well for fax request purposes/SLS

## 2013-05-04 NOTE — Telephone Encounter (Signed)
Termination letter given to Arlys John for pt per MD

## 2013-05-10 ENCOUNTER — Other Ambulatory Visit: Payer: Self-pay | Admitting: *Deleted

## 2013-05-10 MED ORDER — BUTALBITAL-APAP-CAFFEINE 50-325-40 MG PO TABS: 2.0000 | ORAL_TABLET | ORAL | Status: DC | PRN

## 2013-05-10 NOTE — Telephone Encounter (Signed)
Faxed refill request received from pharmacy for Fioricet Last filled by MD on 06.11.14, #120x2 Last AEX - 08.25.14 Next AEX - Discharged from practice; per VO provider, last 30-day refill phoned in per protocol/SLS

## 2013-05-18 ENCOUNTER — Telehealth: Payer: Self-pay | Admitting: Family Medicine

## 2013-05-18 NOTE — Telephone Encounter (Addendum)
Patient dismissed from [facility] by Danise Edge MD , effective May 04, 2013. Dismissal letter sent out by certified / registered mail. DAJ  Received signed domestic return receipt verifying delivery of certified letter on May 30, 2013. Article number [id number] [phone] DAJ

## 2013-05-18 NOTE — Telephone Encounter (Signed)
I informed patient that we cancelled his appointment for 05/23/13 due to being dismissed from the practice. He states that he is aware that he has been dismissed but has not received the letter as of yet. I did inform him that Dr. Abner Greenspan will see him on an emergency basis and refill meds for 30 days(from the day he receives the letter). I was talking to him about this when the phone went dead. I tried calling patient back but went straight to voicemail. I left message for patient to return my phone call if he had any questions.

## 2013-05-23 ENCOUNTER — Ambulatory Visit: Payer: BC Managed Care – PPO | Admitting: Family Medicine

## 2013-07-04 ENCOUNTER — Other Ambulatory Visit: Payer: Self-pay | Admitting: Family Medicine

## 2013-07-10 ENCOUNTER — Other Ambulatory Visit: Payer: Self-pay | Admitting: Family Medicine

## 2013-07-10 NOTE — Telephone Encounter (Signed)
Alprazolam Rx denied as pt has been dismissed as of 04/2013.

## 2015-05-16 ENCOUNTER — Other Ambulatory Visit: Payer: Self-pay | Admitting: Nurse Practitioner

## 2015-05-16 DIAGNOSIS — R51 Headache: Principal | ICD-10-CM

## 2015-05-16 DIAGNOSIS — R519 Headache, unspecified: Secondary | ICD-10-CM

## 2018-12-01 ENCOUNTER — Emergency Department (HOSPITAL_COMMUNITY): Payer: Self-pay

## 2018-12-01 ENCOUNTER — Encounter (HOSPITAL_COMMUNITY): Payer: Self-pay | Admitting: Emergency Medicine

## 2018-12-01 ENCOUNTER — Emergency Department (HOSPITAL_COMMUNITY)
Admission: EM | Admit: 2018-12-01 | Discharge: 2018-12-01 | Disposition: A | Discharge status: Home / Self Care | Payer: Self-pay | Attending: Emergency Medicine | Admitting: Emergency Medicine

## 2018-12-01 DIAGNOSIS — F17210 Nicotine dependence, cigarettes, uncomplicated: Secondary | ICD-10-CM | POA: Insufficient documentation

## 2018-12-01 DIAGNOSIS — N200 Calculus of kidney: Secondary | ICD-10-CM | POA: Insufficient documentation

## 2018-12-01 LAB — URINALYSIS, ROUTINE W REFLEX MICROSCOPIC
Bacteria, UA: NONE SEEN
Bilirubin Urine: NEGATIVE
Glucose, UA: NEGATIVE mg/dL
Ketones, ur: NEGATIVE mg/dL
Leukocytes,Ua: NEGATIVE
Nitrite: NEGATIVE
Protein, ur: NEGATIVE mg/dL
Specific Gravity, Urine: 1.03 (ref 1.005–1.030)
pH: 5 (ref 5.0–8.0)

## 2018-12-01 LAB — CBC WITH DIFFERENTIAL/PLATELET
Abs Immature Granulocytes: 0.05 10*3/uL (ref 0.00–0.07)
Basophils Absolute: 0.1 10*3/uL (ref 0.0–0.1)
Basophils Relative: 0 %
Eosinophils Absolute: 0.1 10*3/uL (ref 0.0–0.5)
Eosinophils Relative: 1 %
HCT: 42.5 % (ref 39.0–52.0)
Hemoglobin: 14 g/dL (ref 13.0–17.0)
Immature Granulocytes: 0 %
Lymphocytes Relative: 13 %
Lymphs Abs: 1.8 10*3/uL (ref 0.7–4.0)
MCH: 32.7 pg (ref 26.0–34.0)
MCHC: 32.9 g/dL (ref 30.0–36.0)
MCV: 99.3 fL (ref 80.0–100.0)
Monocytes Absolute: 0.6 10*3/uL (ref 0.1–1.0)
Monocytes Relative: 5 %
Neutro Abs: 10.8 10*3/uL — ABNORMAL HIGH (ref 1.7–7.7)
Neutrophils Relative %: 81 %
Platelets: 305 10*3/uL (ref 150–400)
RBC: 4.28 MIL/uL (ref 4.22–5.81)
RDW: 13.3 % (ref 11.5–15.5)
WBC: 13.4 10*3/uL — ABNORMAL HIGH (ref 4.0–10.5)
nRBC: 0 % (ref 0.0–0.2)

## 2018-12-01 LAB — LIPASE, BLOOD: Lipase: 22 U/L (ref 11–51)

## 2018-12-01 LAB — HEPATIC FUNCTION PANEL
ALT: 16 U/L (ref 0–44)
AST: 20 U/L (ref 15–41)
Albumin: 4.9 g/dL (ref 3.5–5.0)
Alkaline Phosphatase: 80 U/L (ref 38–126)
Bilirubin, Direct: 0.1 mg/dL (ref 0.0–0.2)
Indirect Bilirubin: 0.2 mg/dL — ABNORMAL LOW (ref 0.3–0.9)
Total Bilirubin: 0.3 mg/dL (ref 0.3–1.2)
Total Protein: 8 g/dL (ref 6.5–8.1)

## 2018-12-01 LAB — BASIC METABOLIC PANEL
Anion gap: 10 (ref 5–15)
BUN: 18 mg/dL (ref 6–20)
CO2: 24 mmol/L (ref 22–32)
Calcium: 9.9 mg/dL (ref 8.9–10.3)
Chloride: 106 mmol/L (ref 98–111)
Creatinine, Ser: 1.18 mg/dL (ref 0.61–1.24)
GFR calc Af Amer: >60 mL/min (ref >60)
GFR calc non Af Amer: >60 mL/min (ref >60)
Glucose, Bld: 134 mg/dL — ABNORMAL HIGH (ref 70–99)
Potassium: 4.4 mmol/L (ref 3.5–5.1)
Sodium: 140 mmol/L (ref 135–145)

## 2018-12-01 MED ORDER — HYDROMORPHONE HCL 1 MG/ML IJ SOLN
1.0000 mg | Freq: Once | INTRAMUSCULAR | Status: DC
  Filled 2018-12-01: qty 1

## 2018-12-01 MED ORDER — FENTANYL CITRATE (PF) 100 MCG/2ML IJ SOLN
50.0000 ug | Freq: Once | INTRAMUSCULAR | Status: AC
  Administered 2018-12-01: 50 ug via INTRAVENOUS
  Filled 2018-12-01: qty 2

## 2018-12-01 MED ORDER — ONDANSETRON HCL 4 MG/2ML IJ SOLN
4.0000 mg | Freq: Once | INTRAMUSCULAR | Status: AC
  Administered 2018-12-01: 4 mg via INTRAVENOUS

## 2018-12-01 MED ORDER — HYDROCODONE-ACETAMINOPHEN 5-325 MG PO TABS: 1.0000 | ORAL_TABLET | Freq: Four times a day (QID) | ORAL | 0 refills | Status: DC | PRN

## 2018-12-01 MED ORDER — SODIUM CHLORIDE 0.9 % IV BOLUS
1000.0000 mL | Freq: Once | INTRAVENOUS | Status: AC
  Administered 2018-12-01: 1000 mL via INTRAVENOUS

## 2018-12-01 MED ORDER — ONDANSETRON HCL 4 MG PO TABS: 4.0000 mg | ORAL_TABLET | Freq: Four times a day (QID) | ORAL | 0 refills | Status: DC

## 2018-12-01 MED ORDER — HYDROMORPHONE HCL 1 MG/ML IJ SOLN
1.0000 mg | Freq: Once | INTRAMUSCULAR | Status: AC
  Administered 2018-12-01: 1 mg via INTRAVENOUS

## 2018-12-01 MED ORDER — PROCHLORPERAZINE EDISYLATE 10 MG/2ML IJ SOLN
10.0000 mg | Freq: Once | INTRAMUSCULAR | Status: AC
  Administered 2018-12-01: 10 mg via INTRAVENOUS
  Filled 2018-12-01: qty 2

## 2018-12-01 MED ORDER — HYDROCODONE-ACETAMINOPHEN 5-325 MG PO TABS
2.0000 | ORAL_TABLET | Freq: Once | ORAL | Status: AC
  Administered 2018-12-01: 2 via ORAL
  Filled 2018-12-01: qty 2

## 2018-12-01 MED ORDER — SODIUM CHLORIDE (PF) 0.9 % IJ SOLN
INTRAMUSCULAR | Status: AC
  Filled 2018-12-01: qty 50

## 2018-12-01 MED ORDER — TAMSULOSIN HCL 0.4 MG PO CAPS: 0.4000 mg | ORAL_CAPSULE | Freq: Every day | ORAL | 0 refills | Status: AC

## 2018-12-01 MED ORDER — ONDANSETRON HCL 4 MG/2ML IJ SOLN
4.0000 mg | Freq: Once | INTRAMUSCULAR | Status: DC
  Filled 2018-12-01: qty 2

## 2018-12-01 MED ORDER — TAMSULOSIN HCL 0.4 MG PO CAPS: 0.4000 mg | ORAL_CAPSULE | Freq: Every day | ORAL | 0 refills | Status: DC

## 2018-12-01 MED ORDER — IOHEXOL 300 MG/ML  SOLN
100.0000 mL | Freq: Once | INTRAMUSCULAR | Status: AC | PRN
  Administered 2018-12-01: 100 mL via INTRAVENOUS

## 2018-12-01 NOTE — ED Notes (Signed)
Pt given urinal, it is at bedside

## 2018-12-01 NOTE — ED Triage Notes (Signed)
LLQ abd pain, "feels like a knife stabbing to my back", hx kidney stones, Nausea w/o Vomiting, 10/10 pain.   HR 80 BP 150/90

## 2018-12-01 NOTE — ED Notes (Signed)
Bed: WA21 Expected date:  Expected time:  Means of arrival:  Comments: LL abd pain

## 2018-12-01 NOTE — ED Notes (Signed)
Pt still unable to produce urine sample at this time

## 2018-12-01 NOTE — ED Provider Notes (Signed)
[facility] DEPT Provider Note   CSN: [id number] Arrival date & time: 12/01/18  1517    History   Chief Complaint Chief Complaint  Patient presents with  . Abdominal Pain    HPI LEMARIO CHAIKIN is a 53 y.o. male.     The history is provided by the patient.  Abdominal Pain  Pain location:  LUQ and LLQ Pain quality: aching and dull   Pain radiates to:  Does not radiate Pain severity:  Moderate Onset quality:  Sudden Timing:  Constant Progression:  Unchanged Chronicity:  New Context comment:  Hx of kidney stones, has had diarrhea recently as well Relieved by:  Nothing Worsened by:  Nothing Associated symptoms: diarrhea and nausea   Associated symptoms: no chest pain, no chills, no cough, no dysuria, no fever, no hematuria, no shortness of breath, no sore throat and no vomiting     Past Medical History:  Diagnosis Date  . Allergy   . Anemia 09/26/2011  . Anxiety and depression 02/13/2009   Qualifier: Diagnosis of  By: Niel Hummer MD, Lorinda Creed   . History of chicken pox   . Hyperlipidemia 09/26/2011  . Migraine   . Other malaise and fatigue 02/07/2013  . Renal lithiasis   . Tobacco abuse 04/23/2012    Patient Active Problem List   Diagnosis Date Noted  . Other malaise and fatigue 02/07/2013  . Tobacco abuse 04/23/2012  . Anemia 09/26/2011  . Hyperlipidemia 09/26/2011  . Allergic state   . History of chicken pox   . Renal lithiasis   . HEADACHE, TENSION, CHRONIC 09/10/2009  . Anxiety and depression 02/13/2009  . MIGRAINE HEADACHE 02/13/2009  . ARTHRITIS 12/21/2007    Past Surgical History:  Procedure Laterality Date  . KNEE SURGERY     at age 35 for injury  . TONSILLECTOMY     adenoidectomy  . TYMPANOSTOMY TUBE PLACEMENT     once        Home Medications    Prior to Admission medications   Medication Sig Start Date End Date Taking? Authorizing Provider  ALPRAZolam Duanne Moron) 1 MG tablet Take 0.5-1 mg by mouth 3 (three)  times daily as needed for anxiety. 11/23/18  Yes [provider]  butalbital-acetaminophen-caffeine (FIORICET, ESGIC) 50-325-40 MG per tablet Take 2 tablets by mouth every 4 (four) hours as needed for pain or headache. Take no more than 8 tablets combined per day of fioricet and hydrocodone. 05/10/13  Yes Mosie Lukes, MD  dimenhyDRINATE (DRAMAMINE) 50 MG tablet Take 50 mg by mouth every 8 (eight) hours as needed for nausea.   Yes [provider]  eletriptan (RELPAX) 40 MG tablet One tablet by mouth at onset of headache. May repeat in 2 hours if headache persists or recurs. May take max 2 daily 02/07/13  Yes Mosie Lukes, MD  loratadine-pseudoephedrine (CLARITIN-D 12-HOUR) 5-120 MG tablet Take 1 tablet by mouth 2 (two) times daily as needed for allergies.   Yes [provider]  escitalopram (LEXAPRO) 10 MG tablet Take 1 tablet (10 mg total) by mouth daily. Patient not taking: Reported on 12/01/2018 04/23/13   Mosie Lukes, MD  fluticasone [facility]) 50 MCG/ACT nasal spray Place 2 sprays into the nose daily. Patient not taking: Reported on 12/01/2018 09/22/11 04/23/13  Mosie Lukes, MD  HYDROcodone-acetaminophen (NORCO/VICODIN) 5-325 MG tablet Take 1 tablet by mouth every 6 (six) hours as needed for up to 15 doses. 12/01/18   Lennice Sites, DO  methocarbamol (ROBAXIN) 500 MG tablet Take 1 tablet (500 mg total) by mouth 3 (three) times daily. Prn muscle spasm Patient not taking: Reported on 12/01/2018 04/23/13   Mosie Lukes, MD  ondansetron (ZOFRAN) 4 MG tablet Take 1 tablet (4 mg total) by mouth every 6 (six) hours. 12/01/18   Arwen Haseley, DO  tamsulosin (FLOMAX) 0.4 MG CAPS capsule Take 1 capsule (0.4 mg total) by mouth daily for 10 doses. 12/01/18 12/11/18  Lennice Sites, DO    Family History Family History  Problem Relation Age of Onset  . Cancer Father        leukemia  . Hypertension Father   . Heart attack Father   . Heart disease Father        MI  . Stroke  Father   . Hypertension Maternal Grandfather   . Diabetes Maternal Grandfather        ?  Marland Kitchen Alzheimer's disease Paternal Grandmother   . Cancer Paternal Grandfather        lung/ smoker    Social History Social History   Tobacco Use  . Smoking status: Current Every Day Smoker    Packs/day: 1.00    Types: Cigarettes  . Smokeless tobacco: Never Used  Substance Use Topics  . Alcohol use: Yes    Comment: occasionally  . Drug use: No     Allergies   Patient has no known allergies.   Review of Systems Review of Systems  Constitutional: Negative for chills and fever.  HENT: Negative for ear pain and sore throat.   Eyes: Negative for pain and visual disturbance.  Respiratory: Negative for cough and shortness of breath.   Cardiovascular: Negative for chest pain and palpitations.  Gastrointestinal: Positive for abdominal pain, diarrhea and nausea. Negative for vomiting.  Genitourinary: Negative for dysuria and hematuria.  Musculoskeletal: Negative for arthralgias and back pain.  Skin: Negative for color change and rash.  Neurological: Negative for seizures and syncope.  All other systems reviewed and are negative.    Physical Exam Updated Vital Signs  ED Triage Vitals  Enc Vitals Group     BP 12/01/18 0638 (!) 136/99     Pulse Rate 12/01/18 0638 87     Resp 12/01/18 0638 (!) 30     Temp 12/01/18 0638 (!) 97.4 F (36.3 C)     Temp Source 12/01/18 0638 Oral     SpO2 12/01/18 0638 100 %     Weight 12/01/18 0638 150 lb (68 kg)     Height 12/01/18 0638 5\' 9"  (1.753 m)     Head Circumference --      Peak Flow --      Pain Score 12/01/18 0640 10     Pain Loc --      Pain Edu? --      Excl. in [place]? --     Physical Exam Vitals signs and nursing note reviewed.  Constitutional:      General: He is not in acute distress.    Appearance: He is well-developed. He is not ill-appearing.  HENT:     Head: Normocephalic and atraumatic.  Eyes:     Conjunctiva/sclera:  Conjunctivae normal.  Neck:     Musculoskeletal: Neck supple.  Cardiovascular:     Rate and Rhythm: Normal rate and regular rhythm.     Heart sounds: Normal heart sounds. No murmur.  Pulmonary:     Effort: Pulmonary effort is normal. No respiratory distress.     Breath sounds: Normal  breath sounds.  Abdominal:     Palpations: Abdomen is soft.     Tenderness: There is abdominal tenderness in the left upper quadrant and left lower quadrant. There is left CVA tenderness. There is no right CVA tenderness, guarding or rebound. Negative signs include Murphy's sign, Rovsing's sign, McBurney's sign and psoas sign.  Skin:    General: Skin is warm and dry.     Capillary Refill: Capillary refill takes less than 2 seconds.  Neurological:     General: No focal deficit present.     Mental Status: He is alert.  Psychiatric:        Mood and Affect: Mood normal.      ED Treatments / Results  Labs (all labs ordered are listed, but only abnormal results are displayed) Labs Reviewed  URINALYSIS, ROUTINE W REFLEX MICROSCOPIC - Abnormal; Notable for the following components:      Result Value   Color, Urine STRAW (*)    Hgb urine dipstick MODERATE (*)    All other components within normal limits  CBC WITH DIFFERENTIAL/PLATELET - Abnormal; Notable for the following components:   WBC 13.4 (*)    Neutro Abs 10.8 (*)    All other components within normal limits  BASIC METABOLIC PANEL - Abnormal; Notable for the following components:   Glucose, Bld 134 (*)    All other components within normal limits  HEPATIC FUNCTION PANEL - Abnormal; Notable for the following components:   Indirect Bilirubin 0.2 (*)    All other components within normal limits  LIPASE, BLOOD    EKG None  Radiology Ct Abdomen Pelvis W Contrast  Result Date: 12/01/2018 CLINICAL DATA:  Generalized abdominal pain EXAM: CT ABDOMEN AND PELVIS WITH CONTRAST TECHNIQUE: Multidetector CT imaging of the abdomen and pelvis was  performed using the standard protocol following bolus administration of intravenous contrast. CONTRAST:  110mL OMNIPAQUE IOHEXOL 300 MG/ML  SOLN COMPARISON:  None. FINDINGS: Lower chest: Lung bases are clear. No effusions. Heart is normal size. Hepatobiliary: Small cyst anteriorly in the right hepatic lobe. No suspicious hepatic abnormality. Gallbladder unremarkable. Pancreas: No focal abnormality or ductal dilatation. Spleen: No focal abnormality.  Normal size. Adrenals/Urinary Tract: Mild left hydronephrosis and hydroureter due to a punctate 1-2 mm distal left ureteral stones, best seen on coronal image 72. No stones or hydronephrosis on the right. No suspicious renal or adrenal mass. Urinary bladder unremarkable. Stomach/Bowel: Stomach, large and small bowel grossly unremarkable. Vascular/Lymphatic: Aortic atherosclerosis. No enlarged abdominal or pelvic lymph nodes. Reproductive: No visible focal abnormality. Other: No free fluid or free air. Musculoskeletal: No acute bony abnormality. Degenerative changes in the lower lumbar spine. IMPRESSION: 1-2 mm distal left ureteral stone with mild left hydronephrosis. Electronically Signed   By: Rolm Baptise M.D.   On: 12/01/2018 09:10    Procedures Procedures (including critical care time)  Medications Ordered in ED Medications  sodium chloride (PF) 0.9 % injection (has no administration in time range)  ondansetron (ZOFRAN) injection 4 mg (4 mg Intravenous Given 12/01/18 0654)  HYDROmorphone (DILAUDID) injection 1 mg (1 mg Intravenous Given 12/01/18 0655)  sodium chloride 0.9 % bolus 1,000 mL (0 mLs Intravenous Stopped 12/01/18 0926)  prochlorperazine (COMPAZINE) injection 10 mg (10 mg Intravenous Given 12/01/18 0835)  fentaNYL (SUBLIMAZE) injection 50 mcg (50 mcg Intravenous Given 12/01/18 0836)  iohexol (OMNIPAQUE) 300 MG/ML solution 100 mL (100 mLs Intravenous Contrast Given 12/01/18 0847)  HYDROcodone-acetaminophen (NORCO/VICODIN) 5-325 MG per tablet 2 tablet (2  tablets Oral Given 12/01/18 0925)  Initial Impression / Assessment and Plan / ED Course  I have reviewed the triage vital signs and the nursing notes.  Pertinent labs & imaging results that were available during my care of the patient were reviewed by me and considered in my medical decision making (see chart for details).     MIKELL KAZLAUSKAS is a 53 year old male with history of high cholesterol, kidney stones who presents to the ED with left flank pain, left lower abdominal pain.  Patient with unremarkable vitals.  No fever.  Pain started overnight.  Denies any hematuria.  Has had some diarrhea over the last several days.  Is tender over the left side of his abdomen, left CVA.  Denies any pain with urination.  Has had nausea and vomiting.  Possible kidney stone versus diverticulitis versus gastroenteritis.  Will get lab work.  Will get CT scan of abdomen and pelvis to further evaluate.  Will give normal saline bolus, IV Compazine, IV fentanyl, IV Zofran.  Patient with no significant findings on lab.  He does have mild leukocytosis but otherwise no acute kidney injury, no electrolyte abnormality.  Gallbladder and liver enzymes unremarkable.  Lipase normal.  CT scan shows 2 mm distal left ureteral stone with mild hydronephrosis.  Kidney function normal.  Mild leukocytosis.  Will treat with Zofran, Flomax, Percocet.  No active narcotic scripts upon chart review.  U/a with no signs of infection.  Given information to follow-up with urology.  Discharged in good condition.  Given return precautions.  This chart was dictated using voice recognition software.  Despite best efforts to proofread,  errors can occur which can change the documentation meaning.    Final Clinical Impressions(s) / ED Diagnoses   Final diagnoses:  Kidney stone    ED Discharge Orders         Ordered    HYDROcodone-acetaminophen (NORCO/VICODIN) 5-325 MG tablet  Every 6 hours PRN     12/01/18 0936    ondansetron (ZOFRAN) 4  MG tablet  Every 6 hours     12/01/18 0936    tamsulosin (FLOMAX) 0.4 MG CAPS capsule  Daily,   Status:  Discontinued     12/01/18 0936    tamsulosin (FLOMAX) 0.4 MG CAPS capsule  Daily     12/01/18 0937           Lennice Sites, DO 12/01/18 1020

## 2018-12-01 NOTE — ED Notes (Signed)
Patient transported to CT 

## 2018-12-01 NOTE — ED Notes (Signed)
Pt reminded of needed urine sample. 

## 2020-03-18 ENCOUNTER — Encounter: Payer: Self-pay | Admitting: Physician Assistant

## 2020-05-23 ENCOUNTER — Telehealth: Payer: Self-pay | Admitting: Pulmonary Disease

## 2020-05-23 ENCOUNTER — Emergency Department (HOSPITAL_COMMUNITY): Payer: BC Managed Care – PPO

## 2020-05-23 ENCOUNTER — Encounter (HOSPITAL_COMMUNITY): Payer: Self-pay | Admitting: Emergency Medicine

## 2020-05-23 ENCOUNTER — Emergency Department (HOSPITAL_COMMUNITY)
Admission: EM | Admit: 2020-05-23 | Discharge: 2020-05-23 | Disposition: A | Discharge status: Home / Self Care | Payer: BC Managed Care – PPO | Attending: Emergency Medicine | Admitting: Emergency Medicine

## 2020-05-23 ENCOUNTER — Other Ambulatory Visit: Payer: Self-pay

## 2020-05-23 DIAGNOSIS — Z20822 Contact with and (suspected) exposure to covid-19: Secondary | ICD-10-CM | POA: Diagnosis not present

## 2020-05-23 DIAGNOSIS — R05 Cough: Secondary | ICD-10-CM | POA: Diagnosis present

## 2020-05-23 DIAGNOSIS — F17210 Nicotine dependence, cigarettes, uncomplicated: Secondary | ICD-10-CM | POA: Diagnosis not present

## 2020-05-23 DIAGNOSIS — R911 Solitary pulmonary nodule: Secondary | ICD-10-CM | POA: Insufficient documentation

## 2020-05-23 DIAGNOSIS — R042 Hemoptysis: Secondary | ICD-10-CM | POA: Diagnosis not present

## 2020-05-23 LAB — CBC WITH DIFFERENTIAL/PLATELET
Abs Immature Granulocytes: 0.02 10*3/uL (ref 0.00–0.07)
Basophils Absolute: 0.1 10*3/uL (ref 0.0–0.1)
Basophils Relative: 1 %
Eosinophils Absolute: 0.1 10*3/uL (ref 0.0–0.5)
Eosinophils Relative: 1 %
HCT: 40 % (ref 39.0–52.0)
Hemoglobin: 13.5 g/dL (ref 13.0–17.0)
Immature Granulocytes: 0 %
Lymphocytes Relative: 20 %
Lymphs Abs: 1.4 10*3/uL (ref 0.7–4.0)
MCH: 32.7 pg (ref 26.0–34.0)
MCHC: 33.8 g/dL (ref 30.0–36.0)
MCV: 96.9 fL (ref 80.0–100.0)
Monocytes Absolute: 0.6 10*3/uL (ref 0.1–1.0)
Monocytes Relative: 8 %
Neutro Abs: 5.2 10*3/uL (ref 1.7–7.7)
Neutrophils Relative %: 70 %
Platelets: 314 10*3/uL (ref 150–400)
RBC: 4.13 MIL/uL — ABNORMAL LOW (ref 4.22–5.81)
RDW: 12.7 % (ref 11.5–15.5)
WBC: 7.2 10*3/uL (ref 4.0–10.5)
nRBC: 0 % (ref 0.0–0.2)

## 2020-05-23 LAB — COMPREHENSIVE METABOLIC PANEL
ALT: 15 U/L (ref 0–44)
AST: 17 U/L (ref 15–41)
Albumin: 4.5 g/dL (ref 3.5–5.0)
Alkaline Phosphatase: 71 U/L (ref 38–126)
Anion gap: 13 (ref 5–15)
BUN: 15 mg/dL (ref 6–20)
CO2: 22 mmol/L (ref 22–32)
Calcium: 9.5 mg/dL (ref 8.9–10.3)
Chloride: 103 mmol/L (ref 98–111)
Creatinine, Ser: 0.77 mg/dL (ref 0.61–1.24)
GFR calc Af Amer: >60 mL/min (ref >60)
GFR calc non Af Amer: >60 mL/min (ref >60)
Glucose, Bld: 97 mg/dL (ref 70–99)
Potassium: 3.9 mmol/L (ref 3.5–5.1)
Sodium: 138 mmol/L (ref 135–145)
Total Bilirubin: 0.4 mg/dL (ref 0.3–1.2)
Total Protein: 7.4 g/dL (ref 6.5–8.1)

## 2020-05-23 LAB — PROTIME-INR
INR: 1 (ref 0.8–1.2)
Prothrombin Time: 12.6 seconds (ref 11.4–15.2)

## 2020-05-23 LAB — TYPE AND SCREEN
ABO/RH(D): O POS
Antibody Screen: NEGATIVE

## 2020-05-23 LAB — RESPIRATORY PANEL BY RT PCR (FLU A&B, COVID)
Influenza A by PCR: NEGATIVE
Influenza B by PCR: NEGATIVE
SARS Coronavirus 2 by RT PCR: NEGATIVE

## 2020-05-23 LAB — D-DIMER, QUANTITATIVE: D-Dimer, Quant: 0.62 ug/mL-FEU — ABNORMAL HIGH (ref 0.00–0.50)

## 2020-05-23 MED ORDER — SODIUM CHLORIDE (PF) 0.9 % IJ SOLN
INTRAMUSCULAR | Status: AC
  Filled 2020-05-23: qty 50

## 2020-05-23 MED ORDER — IOHEXOL 350 MG/ML SOLN
100.0000 mL | Freq: Once | INTRAVENOUS | Status: AC | PRN
  Administered 2020-05-23: 100 mL via INTRAVENOUS

## 2020-05-23 MED ORDER — DOXYCYCLINE HYCLATE 100 MG PO TABS: 100.0000 mg | ORAL_TABLET | Freq: Two times a day (BID) | ORAL | 0 refills | Status: DC

## 2020-05-23 NOTE — ED Provider Notes (Signed)
[facility] Provider Note   CSN: [id number] Arrival date & time: 05/23/20  7001     History Chief Complaint  Patient presents with  . Coughing up Blood    [facility] is a 54 y.o. male.  HPI   Patient presents to the ED for evaluation of hemoptysis.  Patient states he first actually noticed a small amount of blood in his stool couple days ago.  It was bright red.  He has not had any further episodes since that time.  Last evening however he started having episodes of coughing up blood.  Patient states he was not vomiting.  He did feel that he might have been having some nasal drainage.  Coughed up blood a few times last evening.  He called EMS they suggested it might be related to a nosebleed.  He followed up in an urgent care today.  While he was there he did cough up bright red blood.  They also did a rectal exam and noted he was Hemoccult positive although there was no gross blood.  Patient was sent to the ED for further evaluation.  Patient does smoke but denies any respiratory issues.  He does not take any blood thinning agents.  Past Medical History:  Diagnosis Date  . Allergy   . Anemia 09/26/2011  . Anxiety and depression 02/13/2009   Qualifier: Diagnosis of  By: Niel Hummer MD, Lorinda Creed   . History of chicken pox   . Hyperlipidemia 09/26/2011  . Migraine   . Other malaise and fatigue 02/07/2013  . Renal lithiasis   . Tobacco abuse 04/23/2012    Patient Active Problem List   Diagnosis Date Noted  . Other malaise and fatigue 02/07/2013  . Tobacco abuse 04/23/2012  . Anemia 09/26/2011  . Hyperlipidemia 09/26/2011  . Allergic state   . History of chicken pox   . Renal lithiasis   . HEADACHE, TENSION, CHRONIC 09/10/2009  . Anxiety and depression 02/13/2009  . MIGRAINE HEADACHE 02/13/2009  . ARTHRITIS 12/21/2007    Past Surgical History:  Procedure Laterality Date  . KNEE SURGERY     at age 51 for injury  . TONSILLECTOMY      adenoidectomy  . TYMPANOSTOMY TUBE PLACEMENT     once       Family History  Problem Relation Age of Onset  . Cancer Father        leukemia  . Hypertension Father   . Heart attack Father   . Heart disease Father        MI  . Stroke Father   . Hypertension Maternal Grandfather   . Diabetes Maternal Grandfather        ?  Marland Kitchen Alzheimer's disease Paternal Grandmother   . Cancer Paternal Grandfather        lung/ smoker    Social History   Tobacco Use  . Smoking status: Current Every Day Smoker    Packs/day: 1.00    Types: Cigarettes  . Smokeless tobacco: Never Used  Substance Use Topics  . Alcohol use: Yes    Comment: occasionally  . Drug use: No    Home Medications Prior to Admission medications   Medication Sig Start Date End Date Taking? Authorizing Provider  ALPRAZolam Duanne Moron) 1 MG tablet Take 0.5-1 mg by mouth 3 (three) times daily as needed for anxiety. 11/23/18  Yes [provider]  butalbital-acetaminophen-caffeine (FIORICET, ESGIC) 50-325-40 MG per tablet Take 2 tablets by mouth every 4 (four)  hours as needed for pain or headache. Take no more than 8 tablets combined per day of fioricet and hydrocodone. 05/10/13  Yes Mosie Lukes, MD  dextromethorphan-guaiFENesin [facility] DM) 30-600 MG 12hr tablet Take 1 tablet by mouth 2 (two) times daily as needed for cough.   Yes [provider]  doxycycline (VIBRA-TABS) 100 MG tablet Take 1 tablet (100 mg total) by mouth 2 (two) times daily. 05/23/20   Dorie Rank, MD  eletriptan (RELPAX) 40 MG tablet One tablet by mouth at onset of headache. May repeat in 2 hours if headache persists or recurs. May take max 2 daily Patient not taking: Reported on 05/23/2020 02/07/13   Mosie Lukes, MD  escitalopram (LEXAPRO) 10 MG tablet Take 1 tablet (10 mg total) by mouth daily. Patient not taking: Reported on 12/01/2018 04/23/13   Mosie Lukes, MD  HYDROcodone-acetaminophen (NORCO/VICODIN) 5-325 MG tablet Take 1 tablet by  mouth every 6 (six) hours as needed for up to 15 doses. Patient not taking: Reported on 05/23/2020 12/01/18   Lennice Sites, DO  loratadine-pseudoephedrine (CLARITIN-D 12-HOUR) 5-120 MG tablet Take 1 tablet by mouth 2 (two) times daily as needed for allergies. Patient not taking: Reported on 05/23/2020    [provider]  methocarbamol (ROBAXIN) 500 MG tablet Take 1 tablet (500 mg total) by mouth 3 (three) times daily. Prn muscle spasm Patient not taking: Reported on 12/01/2018 04/23/13   Mosie Lukes, MD  ondansetron (ZOFRAN) 4 MG tablet Take 1 tablet (4 mg total) by mouth every 6 (six) hours. Patient not taking: Reported on 05/23/2020 12/01/18   Lennice Sites, DO    Allergies    Patient has no known allergies.  Review of Systems   Review of Systems  All other systems reviewed and are negative.   Physical Exam Updated Vital Signs BP (!) 144/100 (BP Location: Right Arm)   Pulse 83   Temp 98.2 F (36.8 C) (Oral)   Resp 20   Ht 1.753 m (5\' 9" )   Wt 68 kg   SpO2 100%   BMI 22.14 kg/m   Physical Exam Vitals and nursing note reviewed.  Constitutional:      General: He is not in acute distress.    Appearance: He is well-developed.  HENT:     Head: Normocephalic and atraumatic.     Right Ear: External ear normal.     Left Ear: External ear normal.     Nose:     Comments: No blood noted in the nares    Mouth/Throat:     Comments: No blood noted in the oropharynx Eyes:     General: No scleral icterus.       Right eye: No discharge.        Left eye: No discharge.     Conjunctiva/sclera: Conjunctivae normal.  Neck:     Trachea: No tracheal deviation.  Cardiovascular:     Rate and Rhythm: Normal rate and regular rhythm.  Pulmonary:     Effort: Pulmonary effort is normal. No respiratory distress.     Breath sounds: Normal breath sounds. No stridor. No wheezing or rales.  Abdominal:     General: Bowel sounds are normal. There is no distension.     Palpations: Abdomen  is soft.     Tenderness: There is no abdominal tenderness. There is no guarding or rebound.  Musculoskeletal:        General: No tenderness.     Cervical back: Neck supple.  Skin:  General: Skin is warm and dry.     Findings: No rash.  Neurological:     Mental Status: He is alert.     Cranial Nerves: No cranial nerve deficit (no facial droop, extraocular movements intact, no slurred speech).     Sensory: No sensory deficit.     Motor: No abnormal muscle tone or seizure activity.     Coordination: Coordination normal.     ED Results / Procedures / Treatments   Labs (all labs ordered are listed, but only abnormal results are displayed) Labs Reviewed  CBC WITH DIFFERENTIAL/PLATELET - Abnormal; Notable for the following components:      Result Value   RBC 4.13 (*)    All other components within normal limits  D-DIMER, QUANTITATIVE (NOT AT [facility]) - Abnormal; Notable for the following components:   D-Dimer, Quant 0.62 (*)    All other components within normal limits  RESPIRATORY PANEL BY RT PCR (FLU A&B, COVID)  PROTIME-INR  COMPREHENSIVE METABOLIC PANEL  TYPE AND SCREEN  ABO/RH    EKG None  Radiology CT Angio Chest PE W and/or Wo Contrast  Result Date: 05/23/2020 CLINICAL DATA:  Suspect pulmonary embolus. Positive D-dimer. Coughing up blood today. EXAM: CT ANGIOGRAPHY CHEST WITH CONTRAST TECHNIQUE: Multidetector CT imaging of the chest was performed using the standard protocol during bolus administration of intravenous contrast. Multiplanar CT image reconstructions and MIPs were obtained to evaluate the vascular anatomy. CONTRAST:  118mL OMNIPAQUE IOHEXOL 350 MG/ML SOLN COMPARISON:  CT of the abdomen and pelvis on 12/01/2018 fell, chest x-ray 05/23/2020 FINDINGS: Cardiovascular: Satisfactory opacification of the pulmonary arteries to the segmental level. No evidence of pulmonary embolism. Normal heart size. No pericardial effusion. Mediastinum/Nodes: No enlarged mediastinal,  hilar, or axillary lymph nodes. Thyroid gland, trachea, and esophagus demonstrate no significant findings. Lungs/Pleura: Within the LEFT UPPER lobe, there is a part solid mass best seen on images 39 and 40 of series 6. This solid component is 6 millimeters. The ground-glass of component is 2.0 x 1.3 centimeters. Other smaller ground-glass opacities are identified within the lingula and LEFT LOWER lobe. The RIGHT lung is clear. There are no pleural effusions. No consolidations. The airways are patent. Upper Abdomen: 1.2 centimeter low-attenuation lesion is identified within the liver on image 143 of series 4. This lesion cannot be further characterized given the contrast bolus timing. Adrenal glands and remainder of the UPPER abdomen are unremarkable. Musculoskeletal: No chest wall abnormality. No acute or significant osseous findings. Review of the MIP images confirms the above findings. IMPRESSION: 1. Technically adequate exam showing no acute pulmonary embolus. 2. Part solid mass in the LEFT UPPER lobe measuring up to 2.0 centimeters callus solid component 6 millimeters. Follow-up non-contrast CT recommended at 3-6 months to confirm persistence. If unchanged, and solid component remains <6 mm, annual CT is recommended until 5 years of stability has been established. If persistent these nodules should be considered highly suspicious if the solid component of the nodule is 6 mm or greater in size and enlarging. This recommendation follows the consensus statement: Guidelines for Management of Incidental Pulmonary Nodules Detected on CT Images: From the Fleischner Society 2017; Radiology 2017; 284:228-243. 3. Faint ground-glass opacities in the lingula and LEFT LOWER lobe could represent infectious process or hemorrhage. Attention to this area is recommended on follow-up studies. 4. Indeterminate low-attenuation lesion within the liver. Recommend further evaluation with nonemergent MRI of the abdomen with and without  contrast. Electronically Signed   By: Everardo Beals.D.  On: 05/23/2020 13:32   DG Chest Portable 1 View  Result Date: 05/23/2020 CLINICAL DATA:  Hemoptysis EXAM: PORTABLE CHEST 1 VIEW COMPARISON:  None. FINDINGS: Lungs are clear. Heart size and pulmonary vascularity are normal. No adenopathy. No bone lesions. IMPRESSION: No abnormality noted. Electronically Signed   By: Lowella Grip III M.D.   On: 05/23/2020 11:06    Procedures Procedures (including critical care time)  Medications Ordered in ED Medications  sodium chloride (PF) 0.9 % injection (has no administration in time range)  iohexol (OMNIPAQUE) 350 MG/ML injection 100 mL (100 mLs Intravenous Contrast Given 05/23/20 1244)    ED Course  I have reviewed the triage vital signs and the nursing notes.  Pertinent labs & imaging results that were available during my care of the patient were reviewed by me and considered in my medical decision making (see chart for details).  Clinical Course as of May 23 1602  Fri May 23, 2020  1356 Labs reviewed.  Slight increase in D-dimer.  CT scan ordered   [JK]  1357 Covid test is negative.  Hemoglobin is normal   [JK]  1501 Discuss case with Pulmonary, Dr Halford Chessman.  .  No recurrent hemoptysis.  Will dc with close oupt pulm follow up   [JK]    Clinical Course User Index [JK] Dorie Rank, MD   MDM Rules/Calculators/A&P                          Patient presented to ED with complaints of hemoptysis.  Patient has stabilized.  No large hemoptysis in the ED.  Possible patient may have had some epistaxis earlier.  Patient does smoke bronchitis is also a possibility.  He does have a pulmonology nodule and would benefit from close pulmonary follow-up.  Will DC home.  Patient plans to quit smoking.  Warning signs precautions discussed. Final Clinical Impression(s) / ED Diagnoses Final diagnoses:  Hemoptysis  Pulmonary nodule    Rx / DC Orders ED Discharge Orders         Ordered     doxycycline (VIBRA-TABS) 100 MG tablet  2 times daily        05/23/20 1506           Dorie Rank, MD 05/23/20 [phone]

## 2020-05-23 NOTE — ED Triage Notes (Signed)
Patient arrived by EMS from Urgent Care. Patient c/o coughing up bright red blood starting yesterday.   Patient denies pain.   Patient stated this has never happened in the past.   History of Migraines.

## 2020-05-23 NOTE — Telephone Encounter (Signed)
Spoke with Dr. Tomi Bamberger in [facility] ER.  Mr. Matherne presented to ER with epistaxis and hemoptysis.  CT angio chest showed semisolid area in LUL.  Please call to schedule consult visit - consult can be with any physician that has first availability.

## 2020-05-23 NOTE — Discharge Instructions (Signed)
Take the antibiotics as prescribed.  Follow-up with a pulmonary doctor to further evaluate the blood you have been coughing up.  CT scan also showed a nodule and they recommended a follow-up CT scan in 6 months

## 2020-05-23 NOTE — Telephone Encounter (Signed)
Called pt and there was no answer- LMTCB and will forward to front desk pool for further f/u

## 2020-05-28 NOTE — Telephone Encounter (Signed)
Scheduled with Dr. Lamonte Sakai for 06/12/2020.

## 2020-06-12 ENCOUNTER — Ambulatory Visit (INDEPENDENT_AMBULATORY_CARE_PROVIDER_SITE_OTHER): Payer: BC Managed Care – PPO | Admitting: Emergency Medicine

## 2020-06-12 ENCOUNTER — Encounter: Payer: Self-pay | Admitting: Emergency Medicine

## 2020-06-12 ENCOUNTER — Other Ambulatory Visit: Payer: Self-pay

## 2020-06-12 VITALS — BP 130/78 | HR 86 | Temp 98.0°F | Ht 68.5 in | Wt 154.4 lb

## 2020-06-12 DIAGNOSIS — Z720 Tobacco use: Secondary | ICD-10-CM

## 2020-06-12 DIAGNOSIS — R9389 Abnormal findings on diagnostic imaging of other specified body structures: Secondary | ICD-10-CM | POA: Diagnosis not present

## 2020-06-12 DIAGNOSIS — R042 Hemoptysis: Secondary | ICD-10-CM | POA: Diagnosis not present

## 2020-06-12 DIAGNOSIS — R911 Solitary pulmonary nodule: Secondary | ICD-10-CM | POA: Diagnosis not present

## 2020-06-12 NOTE — Assessment & Plan Note (Signed)
Hazy left-sided infiltrates, patchy in the left lower lobe with a more rounded appearance in the left upper lobe, possible solid component there as well.  Question whether this was a pneumonitis/pneumonia, bronchitis that was treated and may have been responsible for his mops.  Question whether this was aspirated blood.  Nothing necessarily suggestive of malignancy but certainly possible.  We will plan to repeat his CT chest in 3 months to look for interval resolution, stability.  If any characteristics suggestive of malignancy or progressive process then we will pursue bronchoscopy and biopsies.

## 2020-06-12 NOTE — Assessment & Plan Note (Signed)
Single self-limited episode that lasted about a day but with a fairly large amount.  He thinks he probably had associated epistaxis, question related to his known sinus disease.  Associated with hazy left lung opacities as described which we will follow.  Seemed to improve with doxycycline.  If it recurs then he certainly merits bronchoscopy for airway inspection, probably also an ENT evaluation.

## 2020-06-12 NOTE — Patient Instructions (Addendum)
We will plan to repeat your CT scan of the chest in late December 2021 to compare with your prior. You would benefit from decreasing your smoking, ultimate goal would be to stop altogether.  Follow with Dr Lamonte Sakai in December or January after your CT so that we can review together.

## 2020-06-12 NOTE — Assessment & Plan Note (Signed)
Discussed cessation with him today.  He is interested in cutting down and in fact is already doing so.  I support him in this.  We will talk next time about setting new goals for decreasing.  Ultimate goal will be to stop altogether.

## 2020-06-12 NOTE — Progress Notes (Signed)
Subjective:    Patient ID: Dustin Ruiz, male    DOB: [birth year], 54 y.o.   MRN: [id number]  HPI 54 year old active smoker (45+ pack years) with a history of migraines, hyperlipidemia, anxiety, allergic rhinitis.  He was seen in the [facility] emergency department on 05/23/2020.  He apparently saw some possible blood in his stool prior to that presentation but it was transient.  Then he began to develop bloody sputum.  He may have had some associated epistaxis - felt like sinus drainage, and he was able to blow blood out of his sinuses. He was having dizziness, and has been dealing with a lot of sinus disease. It persisted for over 24h. Initially fairly large amount, over a cup. He was treated with doxycycline. His dizziness improved and the hemoptysis resolved.  He is active, able to exert. Occasionally has to stop with heavy activity. Has a morning cough with clear mucous.   Chest x-ray 05/23/2020 reviewed by me, showed no obvious abnormality but a subsequent CT-PA 05/23/2020 identified no pulmonary embolism, no adenopathy, groundglass opacities in the left upper and left lower lobe.  The predominant opacity is a part solid masslike lesion 2.0 cm.  The solid component is about 6 mm. Is not on any anticoagulation   Review of Systems As per HPI  Past Medical History:  Diagnosis Date  . Allergy   . Anemia 09/26/2011  . Anxiety and depression 02/13/2009   Qualifier: Diagnosis of  By: Niel Hummer MD, Lorinda Creed   . History of chicken pox   . Hyperlipidemia 09/26/2011  . Migraine   . Other malaise and fatigue 02/07/2013  . Renal lithiasis   . Tobacco abuse 04/23/2012     Family History  Problem Relation Age of Onset  . Cancer Father        leukemia  . Hypertension Father   . Heart attack Father   . Heart disease Father        MI  . Stroke Father   . Hypertension Maternal Grandfather   . Diabetes Maternal Grandfather        ?  Marland Kitchen Alzheimer's disease Paternal Grandmother   . Cancer Paternal  Grandfather        lung/ smoker     Social History   Socioeconomic History  . Marital status: Married    Spouse name: Not on file  . Number of children: Not on file  . Years of education: Not on file  . Highest education level: Not on file  Occupational History  . Not on file  Tobacco Use  . Smoking status: Current Every Day Smoker    Packs/day: 0.25    Types: Cigarettes  . Smokeless tobacco: Never Used  . Tobacco comment: 1 pack every 3 days, used to smoke 2 packs a day  Vaping Use  . Vaping Use: Never used  Substance and Sexual Activity  . Alcohol use: Yes    Comment: occasionally  . Drug use: No  . Sexual activity: Yes  Other Topics Concern  . Not on file  Social History Narrative  . Not on file   Social Determinants of Health   Financial Resource Strain:   . Difficulty of Paying Living Expenses: Not on file  Food Insecurity:   . Worried About Charity fundraiser in the Last Year: Not on file  . Ran Out of Food in the Last Year: Not on file  Transportation Needs:   . Lack of Transportation (Medical): Not  on file  . Lack of Transportation (Non-Medical): Not on file  Physical Activity:   . Days of Exercise per Week: Not on file  . Minutes of Exercise per Session: Not on file  Stress:   . Feeling of Stress : Not on file  Social Connections:   . Frequency of Communication with Friends and Family: Not on file  . Frequency of Social Gatherings with Friends and Family: Not on file  . Attends Religious Services: Not on file  . Active Member of Clubs or Organizations: Not on file  . Attends Archivist Meetings: Not on file  . Marital Status: Not on file  Intimate Partner Violence:   . Fear of Current or Ex-Partner: Not on file  . Emotionally Abused: Not on file  . Physically Abused: Not on file  . 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  No military   No Known Allergies   Outpatient Medications  Prior to Visit  Medication Sig Dispense Refill  . ALPRAZolam (XANAX) 1 MG tablet Take 0.5-1 mg by mouth 3 (three) times daily as needed for anxiety.    . butalbital-acetaminophen-caffeine (FIORICET, ESGIC) 50-325-40 MG per tablet Take 2 tablets by mouth every 4 (four) hours as needed for pain or headache. Take no more than 8 tablets combined per day of fioricet and hydrocodone. 120 tablet 0  . dextromethorphan-guaiFENesin (MUCINEX DM) 30-600 MG 12hr tablet Take 1 tablet by mouth 2 (two) times daily as needed for cough.    . doxycycline (VIBRA-TABS) 100 MG tablet Take 1 tablet (100 mg total) by mouth 2 (two) times daily. 14 tablet 0  . eletriptan (RELPAX) 40 MG tablet One tablet by mouth at onset of headache. May repeat in 2 hours if headache persists or recurs. May take max 2 daily 10 tablet 5  . escitalopram (LEXAPRO) 10 MG tablet Take 1 tablet (10 mg total) by mouth daily. 30 tablet 2  . HYDROcodone-acetaminophen (NORCO/VICODIN) 5-325 MG tablet Take 1 tablet by mouth every 6 (six) hours as needed for up to 15 doses. 10 tablet 0  . loratadine-pseudoephedrine (CLARITIN-D 12-HOUR) 5-120 MG tablet Take 1 tablet by mouth 2 (two) times daily as needed for allergies.     . methocarbamol (ROBAXIN) 500 MG tablet Take 1 tablet (500 mg total) by mouth 3 (three) times daily. Prn muscle spasm 90 tablet 2  . ondansetron (ZOFRAN) 4 MG tablet Take 1 tablet (4 mg total) by mouth every 6 (six) hours. 12 tablet 0  . amLODipine (NORVASC) 5 MG tablet Take 5 mg by mouth daily.     No facility-administered medications prior to visit.         Objective:   Physical Exam  Vitals:   06/12/20 1542  BP: 130/78  Pulse: 86  Temp: 98 F (36.7 C)  TempSrc: Temporal  SpO2: 98%  Weight: 154 lb 6.4 oz (70 kg)  Height: 5' 8.5" (1.74 m)   Gen: Pleasant, well-nourished, in no distress,  normal affect  ENT: No lesions,  mouth clear,  oropharynx clear, no postnasal drip  Neck: No JVD, no stridor  Lungs: No use of  accessory muscles, no crackles or wheezing on normal respiration, no wheeze on forced expiration  Cardiovascular: RRR, heart sounds normal, no murmur or gallops, no peripheral edema  Musculoskeletal: No deformities, no cyanosis or clubbing  Neuro: alert, awake, non focal  Skin: Warm, no lesions or rash  Assessment & Plan:  Hemoptysis Single self-limited episode that lasted about a day but with a fairly large amount.  He thinks he probably had associated epistaxis, question related to his known sinus disease.  Associated with hazy left lung opacities as described which we will follow.  Seemed to improve with doxycycline.  If it recurs then he certainly merits bronchoscopy for airway inspection, probably also an ENT evaluation.  Abnormal CT of the chest Hazy left-sided infiltrates, patchy in the left lower lobe with a more rounded appearance in the left upper lobe, possible solid component there as well.  Question whether this was a pneumonitis/pneumonia, bronchitis that was treated and may have been responsible for his mops.  Question whether this was aspirated blood.  Nothing necessarily suggestive of malignancy but certainly possible.  We will plan to repeat his CT chest in 3 months to look for interval resolution, stability.  If any characteristics suggestive of malignancy or progressive process then we will pursue bronchoscopy and biopsies.  Tobacco abuse Discussed cessation with him today.  He is interested in cutting down and in fact is already doing so.  I support him in this.  We will talk next time about setting new goals for decreasing.  Ultimate goal will be to stop altogether.   Baltazar Apo, MD, PhD 06/12/2020, 4:13 PM [facility] [phone] or if no answer [phone]

## 2020-08-13 ENCOUNTER — Inpatient Hospital Stay: Admission: RE | Admit: 2020-08-13 | Payer: BC Managed Care – PPO | Source: Ambulatory Visit

## 2021-03-23 ENCOUNTER — Telehealth: Payer: Self-pay | Admitting: Hematology and Oncology

## 2021-03-23 NOTE — Telephone Encounter (Signed)
Scheduled appt per referral. Pt aware.  

## 2021-04-02 ENCOUNTER — Other Ambulatory Visit: Payer: Self-pay

## 2021-04-02 ENCOUNTER — Inpatient Hospital Stay: Payer: BC Managed Care – PPO | Attending: Hematology and Oncology | Admitting: Hematology and Oncology

## 2021-04-02 ENCOUNTER — Encounter: Payer: Self-pay | Admitting: Hematology and Oncology

## 2021-04-02 ENCOUNTER — Inpatient Hospital Stay: Payer: BC Managed Care – PPO

## 2021-04-02 VITALS — BP 141/83 | HR 95 | Temp 97.8°F | Resp 18 | Wt 149.2 lb

## 2021-04-02 DIAGNOSIS — Z801 Family history of malignant neoplasm of trachea, bronchus and lung: Secondary | ICD-10-CM | POA: Diagnosis not present

## 2021-04-02 DIAGNOSIS — F17210 Nicotine dependence, cigarettes, uncomplicated: Secondary | ICD-10-CM | POA: Insufficient documentation

## 2021-04-02 DIAGNOSIS — Z806 Family history of leukemia: Secondary | ICD-10-CM | POA: Diagnosis not present

## 2021-04-02 DIAGNOSIS — I10 Essential (primary) hypertension: Secondary | ICD-10-CM | POA: Diagnosis not present

## 2021-04-02 DIAGNOSIS — Z0000 Encounter for general adult medical examination without abnormal findings: Secondary | ICD-10-CM

## 2021-04-02 DIAGNOSIS — F419 Anxiety disorder, unspecified: Secondary | ICD-10-CM | POA: Diagnosis not present

## 2021-04-02 DIAGNOSIS — D72825 Bandemia: Secondary | ICD-10-CM

## 2021-04-02 DIAGNOSIS — Z87442 Personal history of urinary calculi: Secondary | ICD-10-CM | POA: Insufficient documentation

## 2021-04-02 LAB — CMP (CANCER CENTER ONLY)
ALT: 15 U/L (ref 0–44)
AST: 14 U/L — ABNORMAL LOW (ref 15–41)
Albumin: 4.4 g/dL (ref 3.5–5.0)
Alkaline Phosphatase: 109 U/L (ref 38–126)
Anion gap: 11 (ref 5–15)
BUN: 14 mg/dL (ref 6–20)
CO2: 23 mmol/L (ref 22–32)
Calcium: 9.9 mg/dL (ref 8.9–10.3)
Chloride: 107 mmol/L (ref 98–111)
Creatinine: 0.99 mg/dL (ref 0.61–1.24)
GFR, Estimated: >60 mL/min (ref >60)
Glucose, Bld: 84 mg/dL (ref 70–99)
Potassium: 4 mmol/L (ref 3.5–5.1)
Sodium: 141 mmol/L (ref 135–145)
Total Bilirubin: 0.4 mg/dL (ref 0.3–1.2)
Total Protein: 7.4 g/dL (ref 6.5–8.1)

## 2021-04-02 LAB — CBC WITH DIFFERENTIAL (CANCER CENTER ONLY)
Abs Immature Granulocytes: 0.03 10*3/uL (ref 0.00–0.07)
Basophils Absolute: 0.1 10*3/uL (ref 0.0–0.1)
Basophils Relative: 1 %
Eosinophils Absolute: 0.1 10*3/uL (ref 0.0–0.5)
Eosinophils Relative: 1 %
HCT: 40.7 % (ref 39.0–52.0)
Hemoglobin: 13.7 g/dL (ref 13.0–17.0)
Immature Granulocytes: 0 %
Lymphocytes Relative: 32 %
Lymphs Abs: 3.2 10*3/uL (ref 0.7–4.0)
MCH: 32 pg (ref 26.0–34.0)
MCHC: 33.7 g/dL (ref 30.0–36.0)
MCV: 95.1 fL (ref 80.0–100.0)
Monocytes Absolute: 0.4 10*3/uL (ref 0.1–1.0)
Monocytes Relative: 4 %
Neutro Abs: 6.1 10*3/uL (ref 1.7–7.7)
Neutrophils Relative %: 62 %
Platelet Count: 346 10*3/uL (ref 150–400)
RBC: 4.28 MIL/uL (ref 4.22–5.81)
RDW: 12.7 % (ref 11.5–15.5)
WBC Count: 9.9 10*3/uL (ref 4.0–10.5)
nRBC: 0 % (ref 0.0–0.2)

## 2021-04-02 LAB — LACTATE DEHYDROGENASE: LDH: 158 U/L (ref 98–192)

## 2021-04-02 LAB — SEDIMENTATION RATE: Sed Rate: 8 mm/hr (ref 0–16)

## 2021-04-02 LAB — SAVE SMEAR(SSMR), FOR PROVIDER SLIDE REVIEW

## 2021-04-02 LAB — C-REACTIVE PROTEIN: CRP: 0.5 mg/dL (ref <1.0)

## 2021-04-02 NOTE — Progress Notes (Signed)
[facility] [phone]   Fax:([phone] [place] NOTE  Patient Care Team: Shanon Rosser, PA-C as PCP - General (Physician Assistant)  Hematological/Oncological History # Leukocytosis 12/01/2018: WBC 13.4, Hgb 14.0, MCV 99.3, Plt 305. 81% neutrophils 05/23/2020: WBC 7.2, Hgb 13.5, MCV 96.9, Plt 314, 70% neutrophils 03/19/2021: WBC 13.8, Hgb 14.3, MCV 95.4, Plt 358, ANC 8901 (nml 1500-7800). 64.5% neutrophils.  04/02/2021: establish care with Dr. Lorenso Courier   CHIEF COMPLAINTS/PURPOSE OF CONSULTATION:  "Leukocytosis "  HISTORY OF PRESENTING ILLNESS:  Dustin Ruiz 55 y.o. male with medical history significant for anxiety, hypertension, tobacco use, and a prior kidney stone who presents for evaluation of leukocytosis.    On review of the previous records Dustin Ruiz has a single prior episode of leukocytosis on his records from 12/01/2018 at which time his white blood cell count was 13.4 with an 54% neutrophilic predominance in the setting of a kidney stone.  Most recently on 03/20/2021 he had a white blood cell count of 13.8, hemoglobin 14.3, ANC of 8900.  This was of neutrophilic predominance.  Due to concern for this leukocytosis the patient was referred to hematology for further evaluation.  On exam today Dustin Ruiz reports that he has been having issues with blood on urination.  He notes that he feels a pins and needle sensation in his urethra.  He is being followed by urology and yesterday [facility] performed a CT scan of his abdomen.  I also tried to flush the clots out of his bladder.  He notes he is otherwise quite healthy other than his issues with arthritis of the spine.  On further discussion he endorses that he is a smoker and smokes about 1.5 to 2 packs/day.  He is trying to back down to 1 pack/day.  He notes he had prostate cancer screening with his primary care provider but has never had a colonoscopy.  He notes that he is a social drinker and  drinks a few times per week.  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  Other than some fatigue he notes he is virtually asymptomatic.  He denies any fevers, chills, sweats, nausea, vomiting or diarrhea.  A full 10 point ROS is listed below.  MEDICAL HISTORY:  Past Medical History:  Diagnosis Date   Allergy    Anemia 09/26/2011   Anxiety and depression 02/13/2009   Qualifier: Diagnosis of  By: Niel Hummer MD, Lorinda Creed    History of chicken pox    Hyperlipidemia 09/26/2011   Migraine    Other malaise and fatigue 02/07/2013   Renal lithiasis    Tobacco abuse 04/23/2012    SURGICAL HISTORY: Past Surgical History:  Procedure Laterality Date   KNEE SURGERY     at age 14 for injury   TONSILLECTOMY     adenoidectomy   TYMPANOSTOMY TUBE PLACEMENT     once    SOCIAL HISTORY: Social History   Socioeconomic History   Marital status: Married    Spouse name: Not on file   Number of children: Not on file   Years of education: Not on file   Highest education level: Not on file  Occupational History   Not on file  Tobacco Use   Smoking status: Every Day    Packs/day: 0.25    Types: Cigarettes   Smokeless tobacco: Never   Tobacco comments:    1 pack every 3 days, used to smoke 2 packs a day  Vaping Use   Vaping Use: Never used  Substance and Sexual Activity   Alcohol use: Yes    Comment: occasionally   Drug use: No   Sexual activity: Yes  Other Topics Concern   Not on file  Social History Narrative   Not on file   Social Determinants of Health   Financial Resource Strain: Not on file  Food Insecurity: Not on file  Transportation Needs: Not on file  Physical Activity: Not on file  Stress: Not on file  Social Connections: Not on file  Intimate Partner Violence: Not on file    FAMILY HISTORY: Family History  Problem Relation Age of Onset   Cancer Father        leukemia   Hypertension Father    Heart attack Father    Heart disease Father         MI   Stroke Father    Hypertension Maternal Grandfather    Diabetes Maternal Grandfather        ?   Alzheimer's disease Paternal Grandmother    Cancer Paternal Grandfather        lung/ smoker    ALLERGIES:  has No Known Allergies.  MEDICATIONS:  Current Outpatient Medications  Medication Sig Dispense Refill   ALPRAZolam (XANAX) 1 MG tablet Take 0.5-1 mg by mouth 3 (three) times daily as needed for anxiety.     amLODipine (NORVASC) 5 MG tablet Take 5 mg by mouth daily.     butalbital-acetaminophen-caffeine (FIORICET, ESGIC) 50-325-40 MG per tablet Take 2 tablets by mouth every 4 (four) hours as needed for pain or headache. Take no more than 8 tablets combined per day of fioricet and hydrocodone. 120 tablet 0   eletriptan (RELPAX) 40 MG tablet One tablet by mouth at onset of headache. May repeat in 2 hours if headache persists or recurs. May take max 2 daily 10 tablet 5   phenazopyridine (PYRIDIUM) 100 MG tablet Take 100 mg by mouth 3 (three) times daily as needed.     No current facility-administered medications for this visit.    REVIEW OF SYSTEMS:   Constitutional: ( - ) fevers, ( - )  chills , ( - ) night sweats Eyes: ( - ) blurriness of vision, ( - ) double vision, ( - ) watery eyes Ears, nose, mouth, throat, and face: ( - ) mucositis, ( - ) sore throat Respiratory: ( - ) cough, ( - ) dyspnea, ( - ) wheezes Cardiovascular: ( - ) palpitation, ( - ) chest discomfort, ( - ) lower extremity swelling Gastrointestinal:  ( - ) nausea, ( - ) heartburn, ( - ) change in bowel habits Skin: ( - ) abnormal skin rashes Lymphatics: ( - ) new lymphadenopathy, ( - ) easy bruising Neurological: ( - ) numbness, ( - ) tingling, ( - ) new weaknesses Behavioral/Psych: ( - ) mood change, ( - ) new changes  All other systems were reviewed with the patient and are negative.  PHYSICAL EXAMINATION: ECOG PERFORMANCE STATUS: 0 - Asymptomatic  Vitals:   04/02/21 1257  BP: (!) 141/83  Pulse: 95   Resp: 18  Temp: 97.8 F (36.6 C)  SpO2: 98%   Filed Weights   04/02/21 1257  Weight: 149 lb 3 oz (67.7 kg)    GENERAL: well appearing middle-aged Caucasian male in NAD  SKIN: skin color, texture, turgor are normal, no rashes or significant lesions EYES: conjunctiva are pink and non-injected, sclera clear LUNGS: clear to auscultation and  percussion with normal breathing effort HEART: regular rate & rhythm and no murmurs and no lower extremity edema PSYCH: alert & oriented x 3, fluent speech NEURO: no focal motor/sensory deficits  LABORATORY DATA:  I have reviewed the data as listed CBC Latest Ref Rng & Units 04/02/2021 05/23/2020 12/01/2018  WBC 4.0 - 10.5 K/uL 9.9 7.2 13.4(H)  Hemoglobin 13.0 - 17.0 g/dL 13.7 13.5 14.0  Hematocrit 39.0 - 52.0 % 40.7 40.0 42.5  Platelets 150 - 400 K/uL 346 314 305    CMP Latest Ref Rng & Units 04/02/2021 05/23/2020 12/01/2018  Glucose 70 - 99 mg/dL 84 97 134(H)  BUN 6 - 20 mg/dL '14 15 18  ' Creatinine 0.61 - 1.24 mg/dL 0.99 0.77 1.18  Sodium 135 - 145 mmol/L 141 138 140  Potassium 3.5 - 5.1 mmol/L 4.0 3.9 4.4  Chloride 98 - 111 mmol/L 107 103 106  CO2 22 - 32 mmol/L '23 22 24  ' Calcium 8.9 - 10.3 mg/dL 9.9 9.5 9.9  Total Protein 6.5 - 8.1 g/dL 7.4 7.4 8.0  Total Bilirubin 0.3 - 1.2 mg/dL 0.4 0.4 0.3  Alkaline Phos 38 - 126 U/L 109 71 80  AST 15 - 41 U/L 14(L) 17 20  ALT 0 - 44 U/L '15 15 16    ' RADIOGRAPHIC STUDIES: No results found.  ASSESSMENT & PLAN Dustin Ruiz 55 y.o. male with medical history significant for anxiety, hypertension, tobacco use, and a prior kidney stone who presents for evaluation of leukocytosis.    After review of the labs, review of the records, and discussion with the patient the patients findings are most consistent with a transient neutrophilic predominant leukocytosis.  The patient has had this happen before when he had a kidney stone and is currently presenting with signs and symptoms concerning for recurrent stone  with the blood clot he is passing his urine.  Additionally the patient is a heavy smoker which may be contributing to this increase in white blood cell count.  Overall these findings appear benign but we will do a thorough work-up in order to rule out other possible etiologies.  We will order ESR, CRP to look for inflammatory sources.  Additionally we will review peripheral blood films in order to assure there are no abnormalities with white blood cells.  #Leukocytosis, Neutrophilic Predominance -- Findings are most consistent with a transient neutrophilic predominant leukocytosis.  Likely secondary to kidney stone --We will repeat CBC and CMP today.  Additionally we will collect ESR and CRP to look for inflammatory causes. --Encourage smoking cessation --No clear evidence of a more serious blood disorder such as an MPN or leukemia. --If white blood cell count returns to normal would recommend that the patient return to clinic as needed.  #Healthcare Maintenance -- Recommend patient have age-appropriate cancer screenings including colonoscopy and prostate cancer screening.  Patient reports he has had a prostate cancer screening with his PCP --We will order colonoscopy today.  Orders Placed This Encounter  Procedures   CBC with Differential ([facility] Only)    Standing Status:   Future    Number of Occurrences:   1    Standing Expiration Date:   04/02/2022   CMP ([facility] only)    Standing Status:   Future    Number of Occurrences:   1    Standing Expiration Date:   04/02/2022   Lactate dehydrogenase (LDH)    Standing Status:   Future    Number of Occurrences:   1  Standing Expiration Date:   04/02/2022   Sedimentation rate    Standing Status:   Future    Number of Occurrences:   1    Standing Expiration Date:   04/02/2022   C-reactive protein    Standing Status:   Future    Number of Occurrences:   1    Standing Expiration Date:   04/02/2022   Save Smear (SSMR)    Standing  Status:   Future    Number of Occurrences:   1    Standing Expiration Date:   04/02/2022   Ambulatory referral to Gastroenterology    Referral Priority:   Routine    Referral Type:   Consultation    Referral Reason:   Specialty Services Required    Number of Visits Requested:   1    All questions were answered. The patient knows to call the clinic with any problems, questions or concerns.  A total of more than 60 minutes were spent on this encounter with face-to-face time and non-face-to-face time, including preparing to see the patient, ordering tests and/or medications, counseling the patient and coordination of care as outlined above.   Ledell Peoples, MD Department of Hematology/Oncology [place] [facility] Phone: [phone] Pager: [phone] Email: [email or web address]  04/02/2021 4:02 PM

## 2021-04-03 ENCOUNTER — Telehealth: Payer: Self-pay | Admitting: *Deleted

## 2021-04-03 NOTE — Telephone Encounter (Signed)
-----   Message from Ledell Peoples IV, MD sent at 04/03/2021  8:55 AM EDT ----- Please let Dustin Ruiz know that his blood counts return to normal. His WBC is now 9.9, well within normal limits. The likely cause is either a kidney stone (causing his hematuria) or a transient infection/inflammation. There were no other concerning abnormalities on his blood smear or labs. Encourage him to schedule the colonoscopy with [facility]. There is no need for routine f/u in our clinic.  ----- Message ----- From: Buel Ream, Lab In [place] Sent: 04/02/2021   2:31 PM EDT To: Orson Slick, MD

## 2021-04-03 NOTE — Telephone Encounter (Signed)
TCT patient regarding his recent lab results. Spoke with him and advised that his WBCs have returned to normal levels. The increase that was noted in his WBC was likely related to his kidney stone issues he was having. Py voiced understanding. He did say that the urologist saw a mass in or near his bladder and they is being looked into @ the Urologist office.  Dr. Lorenso Courier is aware of this.  Pt states he will keep [place] up to date on that situation.

## 2021-04-07 ENCOUNTER — Other Ambulatory Visit: Payer: Self-pay | Admitting: Urology

## 2021-04-14 MED ORDER — GEMCITABINE CHEMO FOR BLADDER INSTILLATION 2000 MG: 2000.0000 mg | Freq: Once | INTRAVENOUS | Status: AC

## 2021-04-23 ENCOUNTER — Encounter (HOSPITAL_BASED_OUTPATIENT_CLINIC_OR_DEPARTMENT_OTHER): Payer: Self-pay | Admitting: Urology

## 2021-04-24 ENCOUNTER — Encounter (HOSPITAL_BASED_OUTPATIENT_CLINIC_OR_DEPARTMENT_OTHER): Payer: Self-pay | Admitting: Urology

## 2021-04-24 ENCOUNTER — Other Ambulatory Visit: Payer: Self-pay

## 2021-04-24 NOTE — Progress Notes (Signed)
Spoke w/ via phone for pre-op interview--- pt Lab needs dos----  EKG             Lab results------ pt has current labs  dated 04-02-2021, CBCdiff and CMP, results in epic COVID test -----patient states asymptomatic no test needed Arrive at -------  0845 on 04-28-2021 NPO after MN NO Solid Food.  Clear liquids from MN until--- 0745 Med rec completed Medications to take morning of surgery ----- Norvasc Diabetic medication ----- n/a Patient instructed no nail polish to be worn day of surgery Patient instructed to bring photo id and insurance card day of surgery Patient aware to have Driver (ride ) / caregiver for 24 hours after surgery ---wife, Shirlean Mylar Patient Special Instructions ----- n/a Pre-Op special Istructions ----- n/a Patient verbalized understanding of instructions that were given at this phone interview. Patient denies shortness of breath, chest pain, fever, cough at this phone interview.

## 2021-04-28 ENCOUNTER — Encounter (HOSPITAL_BASED_OUTPATIENT_CLINIC_OR_DEPARTMENT_OTHER): Payer: Self-pay | Admitting: Urology

## 2021-04-28 ENCOUNTER — Ambulatory Visit (HOSPITAL_BASED_OUTPATIENT_CLINIC_OR_DEPARTMENT_OTHER): Payer: BC Managed Care – PPO | Admitting: Certified Registered"

## 2021-04-28 ENCOUNTER — Encounter (HOSPITAL_BASED_OUTPATIENT_CLINIC_OR_DEPARTMENT_OTHER)
Admission: RE | Disposition: A | Discharge status: Home / Self Care | Payer: Self-pay | Source: Home / Self Care | Attending: Urology

## 2021-04-28 ENCOUNTER — Other Ambulatory Visit: Payer: Self-pay

## 2021-04-28 ENCOUNTER — Ambulatory Visit (HOSPITAL_BASED_OUTPATIENT_CLINIC_OR_DEPARTMENT_OTHER)
Admission: RE | Admit: 2021-04-28 | Discharge: 2021-04-28 | Disposition: A | Discharge status: Home / Self Care | Payer: BC Managed Care – PPO | Attending: Urology | Admitting: Urology

## 2021-04-28 ENCOUNTER — Ambulatory Visit (HOSPITAL_COMMUNITY): Payer: BC Managed Care – PPO

## 2021-04-28 DIAGNOSIS — Z8249 Family history of ischemic heart disease and other diseases of the circulatory system: Secondary | ICD-10-CM | POA: Diagnosis not present

## 2021-04-28 DIAGNOSIS — R310 Gross hematuria: Secondary | ICD-10-CM | POA: Insufficient documentation

## 2021-04-28 DIAGNOSIS — Z806 Family history of leukemia: Secondary | ICD-10-CM | POA: Diagnosis not present

## 2021-04-28 DIAGNOSIS — E785 Hyperlipidemia, unspecified: Secondary | ICD-10-CM | POA: Insufficient documentation

## 2021-04-28 DIAGNOSIS — D494 Neoplasm of unspecified behavior of bladder: Secondary | ICD-10-CM | POA: Diagnosis present

## 2021-04-28 DIAGNOSIS — C679 Malignant neoplasm of bladder, unspecified: Secondary | ICD-10-CM | POA: Diagnosis not present

## 2021-04-28 DIAGNOSIS — Z801 Family history of malignant neoplasm of trachea, bronchus and lung: Secondary | ICD-10-CM | POA: Insufficient documentation

## 2021-04-28 DIAGNOSIS — Z87891 Personal history of nicotine dependence: Secondary | ICD-10-CM | POA: Diagnosis not present

## 2021-04-28 HISTORY — DX: Personal history of urinary calculi: Z87.442

## 2021-04-28 HISTORY — DX: Other intervertebral disc degeneration, lumbar region: M51.36

## 2021-04-28 HISTORY — DX: Complete loss of teeth, unspecified cause, unspecified class: K08.109

## 2021-04-28 HISTORY — DX: Anxiety disorder, unspecified: F41.9

## 2021-04-28 HISTORY — DX: Malignant neoplasm of bladder, unspecified: C67.9

## 2021-04-28 HISTORY — DX: Presence of spectacles and contact lenses: Z97.3

## 2021-04-28 HISTORY — DX: Essential (primary) hypertension: I10

## 2021-04-28 HISTORY — DX: Bandemia: D72.825

## 2021-04-28 HISTORY — DX: Other intervertebral disc degeneration, lumbar region without mention of lumbar back pain or lower extremity pain: M51.369

## 2021-04-28 HISTORY — PX: TRANSURETHRAL RESECTION OF BLADDER TUMOR WITH MITOMYCIN-C: SHX6459

## 2021-04-28 HISTORY — DX: Dysuria: R30.0

## 2021-04-28 HISTORY — PX: CYSTOSCOPY: SHX5120

## 2021-04-28 HISTORY — DX: Hematuria, unspecified: R31.9

## 2021-04-28 HISTORY — DX: Tension-type headache, unspecified, not intractable: G44.209

## 2021-04-28 SURGERY — TRANSURETHRAL RESECTION OF BLADDER TUMOR WITH MITOMYCIN-C
Anesthesia: General

## 2021-04-28 MED ORDER — PROPOFOL 10 MG/ML IV BOLUS
INTRAVENOUS | Status: DC | PRN
  Administered 2021-04-28: 200 mg via INTRAVENOUS

## 2021-04-28 MED ORDER — LACTATED RINGERS IV SOLN: INTRAVENOUS | Status: DC

## 2021-04-28 MED ORDER — LIDOCAINE HCL (PF) 2 % IJ SOLN
INTRAMUSCULAR | Status: AC
  Filled 2021-04-28: qty 5

## 2021-04-28 MED ORDER — LIDOCAINE 2% (20 MG/ML) 5 ML SYRINGE
INTRAMUSCULAR | Status: DC | PRN
  Administered 2021-04-28: 100 mg via INTRAVENOUS

## 2021-04-28 MED ORDER — FENTANYL CITRATE (PF) 100 MCG/2ML IJ SOLN
INTRAMUSCULAR | Status: AC
  Filled 2021-04-28: qty 2

## 2021-04-28 MED ORDER — AMISULPRIDE (ANTIEMETIC) 5 MG/2ML IV SOLN: 10.0000 mg | Freq: Once | INTRAVENOUS | Status: DC | PRN

## 2021-04-28 MED ORDER — TAMSULOSIN HCL 0.4 MG PO CAPS: 0.4000 mg | ORAL_CAPSULE | Freq: Every day | ORAL | 0 refills | Status: AC

## 2021-04-28 MED ORDER — CEFAZOLIN SODIUM-DEXTROSE 2-4 GM/100ML-% IV SOLN
2.0000 g | INTRAVENOUS | Status: AC
  Administered 2021-04-28: 2 g via INTRAVENOUS

## 2021-04-28 MED ORDER — PROPOFOL 10 MG/ML IV BOLUS
INTRAVENOUS | Status: AC
  Filled 2021-04-28: qty 20

## 2021-04-28 MED ORDER — TRAMADOL HCL 50 MG PO TABS: 50.0000 mg | ORAL_TABLET | Freq: Four times a day (QID) | ORAL | 0 refills | Status: DC | PRN

## 2021-04-28 MED ORDER — STERILE WATER FOR IRRIGATION IR SOLN
Status: DC | PRN
  Administered 2021-04-28: 500 mL

## 2021-04-28 MED ORDER — OXYCODONE HCL 5 MG PO TABS: 5.0000 mg | ORAL_TABLET | Freq: Once | ORAL | Status: DC | PRN

## 2021-04-28 MED ORDER — OXYCODONE HCL 5 MG/5ML PO SOLN
5.0000 mg | Freq: Once | ORAL | Status: DC | PRN
Start: 2021-04-28 — End: 2021-04-28

## 2021-04-28 MED ORDER — EPHEDRINE 5 MG/ML INJ
INTRAVENOUS | Status: AC
  Filled 2021-04-28: qty 5

## 2021-04-28 MED ORDER — ONDANSETRON HCL 4 MG/2ML IJ SOLN: 4.0000 mg | Freq: Once | INTRAMUSCULAR | Status: DC | PRN

## 2021-04-28 MED ORDER — FENTANYL CITRATE (PF) 100 MCG/2ML IJ SOLN
INTRAMUSCULAR | Status: DC | PRN
  Administered 2021-04-28: 100 ug via INTRAVENOUS
  Administered 2021-04-28: 25 ug via INTRAVENOUS

## 2021-04-28 MED ORDER — CEPHALEXIN 500 MG PO CAPS: 500.0000 mg | ORAL_CAPSULE | Freq: Every day | ORAL | 0 refills | Status: AC

## 2021-04-28 MED ORDER — IOHEXOL 300 MG/ML  SOLN
INTRAMUSCULAR | Status: DC | PRN
  Administered 2021-04-28: 8 mL via URETHRAL

## 2021-04-28 MED ORDER — ONDANSETRON HCL 4 MG/2ML IJ SOLN
INTRAMUSCULAR | Status: AC
  Filled 2021-04-28: qty 2

## 2021-04-28 MED ORDER — SODIUM CHLORIDE 0.9 % IR SOLN
Status: DC | PRN
  Administered 2021-04-28 (×4): 3000 mL

## 2021-04-28 MED ORDER — ONDANSETRON HCL 4 MG/2ML IJ SOLN
INTRAMUSCULAR | Status: DC | PRN
  Administered 2021-04-28: 4 mg via INTRAVENOUS

## 2021-04-28 MED ORDER — FENTANYL CITRATE (PF) 100 MCG/2ML IJ SOLN
25.0000 ug | INTRAMUSCULAR | Status: DC | PRN
  Administered 2021-04-28: 25 ug via INTRAVENOUS

## 2021-04-28 MED ORDER — MIDAZOLAM HCL 2 MG/2ML IJ SOLN
INTRAMUSCULAR | Status: AC
  Filled 2021-04-28: qty 2

## 2021-04-28 MED ORDER — CEFAZOLIN SODIUM-DEXTROSE 2-4 GM/100ML-% IV SOLN
INTRAVENOUS | Status: AC
  Filled 2021-04-28: qty 100

## 2021-04-28 MED ORDER — MIDAZOLAM HCL 5 MG/5ML IJ SOLN
INTRAMUSCULAR | Status: DC | PRN
Start: 2021-04-28 — End: 2021-04-28
  Administered 2021-04-28: 2 mg via INTRAVENOUS

## 2021-04-28 MED ORDER — DEXAMETHASONE SODIUM PHOSPHATE 10 MG/ML IJ SOLN
INTRAMUSCULAR | Status: AC
  Filled 2021-04-28: qty 1

## 2021-04-28 MED ORDER — DEXAMETHASONE SODIUM PHOSPHATE 10 MG/ML IJ SOLN
INTRAMUSCULAR | Status: DC | PRN
  Administered 2021-04-28: 10 mg via INTRAVENOUS

## 2021-04-28 SURGICAL SUPPLY — 26 items
BAG DRAIN URO-CYSTO SKYTR STRL (DRAIN) ×3 IMPLANT
BAG DRN RND TRDRP ANRFLXCHMBR (UROLOGICAL SUPPLIES)
BAG DRN UROCATH (DRAIN) ×2
BAG URINE DRAIN 2000ML AR STRL (UROLOGICAL SUPPLIES) IMPLANT
CATH FOLEY 2WAY SLVR  5CC 18FR (CATHETERS)
CATH FOLEY 2WAY SLVR 5CC 18FR (CATHETERS) IMPLANT
CATH URET 5FR 28IN OPEN ENDED (CATHETERS) ×1 IMPLANT
CLOTH BEACON ORANGE TIMEOUT ST (SAFETY) ×3 IMPLANT
ELECT REM PT RETURN 9FT ADLT (ELECTROSURGICAL)
ELECTRODE REM PT RTRN 9FT ADLT (ELECTROSURGICAL) ×2 IMPLANT
GLOVE SURG ENC MOIS LTX SZ6.5 (GLOVE) ×4 IMPLANT
GLOVE SURG UNDER POLY LF SZ7 (GLOVE) ×1 IMPLANT
GOWN STRL REUS W/TWL LRG LVL3 (GOWN DISPOSABLE) ×5 IMPLANT
HOLDER FOLEY CATH W/STRAP (MISCELLANEOUS) IMPLANT
IV NS IRRIG 3000ML ARTHROMATIC (IV SOLUTION) ×8 IMPLANT
KIT TURNOVER CYSTO (KITS) ×3 IMPLANT
LOOP CUT BIPOLAR 24F LRG (ELECTROSURGICAL) ×1 IMPLANT
MANIFOLD NEPTUNE II (INSTRUMENTS) ×3 IMPLANT
PACK CYSTO (CUSTOM PROCEDURE TRAY) ×3 IMPLANT
STENT PERCUFLEX 4.8FRX26 (STENTS) ×1 IMPLANT
STENT URET 6FRX26 CONTOUR (STENTS) IMPLANT
SYR TOOMEY IRRIG 70ML (MISCELLANEOUS) ×3
SYRINGE TOOMEY IRRIG 70ML (MISCELLANEOUS) ×2 IMPLANT
TUBE CONNECTING 12X1/4 (SUCTIONS) ×3 IMPLANT
TUBING UROLOGY SET (TUBING) ×3 IMPLANT
WATER STERILE IRR 500ML POUR (IV SOLUTION) ×1 IMPLANT

## 2021-04-28 NOTE — H&P (Signed)
CC/HPI: cc: gross hematuria   03/27/21: 55 year old man referred for painless gross hematuria. He is unsure if he has a family history of bladder cancer. He has a maternal aunt with bladder issues. He has significant tobacco history with 1.5 packs per day times 40 years. He is also being referred to Hematology-Oncology for an elevated white blood cell count. He has been passing clots for proximally 2 weeks now. He is on pyridium to help with painful urination. His urinalysis today shows rare bacteria.     ALLERGIES: No Known Drug Allergies    MEDICATIONS: Phenazopyridine Hcl  Amlodipine Besilate  Ferrocite  Xanax     GU PSH: None   NON-GU PSH: Anesth, Tympanotomy Knee Arthroscopy Tonsillectomy     GU PMH: Renal calculus    NON-GU PMH: Allergy, unspecified, initial encounter Anemia, unspecified Anxiety Arthropathy, unspecified Hemoptysis Hypercholesterolemia Hyperlipidemia, unspecified Other malaise Tension-type headache, unspecified, not intractable Tobacco use    FAMILY HISTORY: 1 Daughter - Runs in Family 1 son - Runs in Family Alzheimer's Disease - Runs in Family Diabetes - Runs in Family Heart Attack - Runs in Family Heart Disease - Runs in Family Hypertension - Runs in Family leukemia - Father Lung Cancer - Grandfather Stroke Syndrome - Runs in Family   SOCIAL HISTORY: Marital Status: Married Preferred Language: English; Ethnicity: Not Hispanic Or Latino; Race: White Current Smoking Status: Patient does not smoke anymore.   Tobacco Use Assessment Completed: Used Tobacco in last 30 days? Does drink.     REVIEW OF SYSTEMS:    GU Review Male:   Patient denies frequent urination, hard to postpone urination, burning/ pain with urination, get up at night to urinate, leakage of urine, stream starts and stops, trouble starting your stream, have to strain to urinate , erection problems, and penile pain.  Gastrointestinal (Upper):   Patient denies nausea, vomiting,  and indigestion/ heartburn.  Gastrointestinal (Lower):   Patient denies diarrhea and constipation.  Constitutional:   Patient denies fever, night sweats, weight loss, and fatigue.  Skin:   Patient denies skin rash/ lesion and itching.  Eyes:   Patient denies blurred vision and double vision.  Ears/ Nose/ Throat:   Patient denies sore throat and sinus problems.  Hematologic/Lymphatic:   Patient denies swollen glands and easy bruising.  Cardiovascular:   Patient denies leg swelling and chest pains.  Respiratory:   Patient denies cough and shortness of breath.  Endocrine:   Patient denies excessive thirst.  Musculoskeletal:   Patient denies joint pain and back pain.  Neurological:   Patient denies headaches and dizziness.  Psychologic:   Patient denies depression and anxiety.   VITAL SIGNS:      03/27/2021 09:40 AM  Weight 150 lb / 68.04 kg  Height 69 in / 175.26 cm  BP 145/76 mmHg  Pulse 84 /min  Temperature 98.2 F / 36.7 C  BMI 22.1 kg/m   GU PHYSICAL EXAMINATION:    Scrotum: No lesions. No edema. No cysts. No warts.  Epididymides: Right: no spermatocele, no masses, no cysts, no tenderness, no induration, no enlargement. Left: no spermatocele, no masses, no cysts, no tenderness, no induration, no enlargement.  Testes: No tenderness, no swelling, no enlargement left testes. No tenderness, no swelling, no enlargement right testes. Normal location left testes. Normal location right testes. No mass, no cyst, no varicocele, no hydrocele left testes. No mass, no cyst, no varicocele, no hydrocele right testes.  Urethral Meatus: Normal size. No lesion, no wart, no discharge, no  polyp. Normal location.  Penis: Circumcised, no warts, no cracks. No dorsal Peyronie's plaques, no left corporal Peyronie's plaques, no right corporal Peyronie's plaques, no scarring, no warts. No balanitis, no meatal stenosis.   MULTI-SYSTEM PHYSICAL EXAMINATION:    Constitutional: Well-nourished. No physical  deformities. Normally developed. Good grooming.  Neck: Neck symmetrical, not swollen. Normal tracheal position.  Respiratory: No labored breathing, no use of accessory muscles.   Skin: No paleness, no jaundice, no cyanosis. No lesion, no ulcer, no rash.  Neurologic / Psychiatric: Oriented to time, oriented to place, oriented to person. No depression, no anxiety, no agitation.  Gastrointestinal: No rigidity, non obese abdomen.   Eyes: Normal conjunctivae. Normal eyelids.  Ears, Nose, Mouth, and Throat: Left ear no scars, no lesions, no masses. Right ear no scars, no lesions, no masses. Nose no scars, no lesions, no masses. Normal hearing. Normal lips.  Musculoskeletal: Normal gait and station of head and neck.     Complexity of Data:  Records Review:   Previous Patient Records, POC Tool  Urine Test Review:   Urinalysis  Notes:                     05/23/2020: BUN 15, creatinine 0.77   PROCEDURES:         Flexible Cystoscopy - 52000  Risks, benefits, and some of the potential complications of the procedure were discussed at length with the patient including infection, bleeding, voiding discomfort, urinary retention, fever, chills, sepsis, and others. All questions were answered. Informed consent was obtained. Sterile technique and intraurethral analgesia were used.  Meatus:  Normal size. Normal location. Normal condition.  Urethra:  No strictures.  External Sphincter:  Normal.  Verumontanum:  Normal.  Prostate:  Obstructing lateral lobes.   Bladder Neck:  Non-obstructing.  Ureteral Orifices:  Normal location. Normal size. Normal shape. Effluxed clear urine.  Bladder:  Poor visualization due to discoloration from pyridium, blood and blood clot      The lower urinary tract was carefully examined. The procedure was well-tolerated and without complications. Antibiotic instructions were given. Instructions were given to call the office immediately for bloody urine, difficulty urinating, urinary  retention, painful or frequent urination, fever, chills, nausea, vomiting or other illness. The patient stated that he understood these instructions and would comply with them.         Urinalysis w/Scope - 81001 Dipstick Dipstick Cont'd Micro  Color: Red Bilirubin: Invalid mg/dL WBC/hpf: NS (Not Seen)  Appearance: Turbid Ketones: Invalid mg/dL RBC/hpf: Packed/hpf  Specific Gravity: Invalid Blood: Invalid ery/uL Bacteria: Rare (0-9/hpf)  pH: Invalid Protein: Invalid mg/dL Cystals: NS (Not Seen)  Glucose: Invalid mg/dL Urobilinogen: Invalid mg/dL Casts: NS (Not Seen)    Nitrites: Invalid Trichomonas: Not Present    Leukocyte Esterase: Invalid leu/uL Mucous: Not Present      Epithelial Cells: NS (Not Seen)      Yeast: NS (Not Seen)      Sperm: Not Present    Notes: No dip' \\T'$ \ spun micro due to color/clarity    ASSESSMENT:      ICD-10 Details  1 GU:   Gross hematuria - R31.0 Undiagnosed New Problem   PLAN:           Orders X-Rays: C.T. Hematuria With and Without I.V. Contrast          Schedule         Document Letter(s):  Created for Patient: Clinical Summary         Notes:  Cystoscopy performed today which did not reveal clear bladder tumor however visualization was poor due to discoloration from peridium, bleeding and blood clot. After this cystoscopy a 20 French coude Foley catheter was placed and the bladder was irrigated to see if the clot could be removed. It was unable to comes to the catheter however after the catheter was removed the patient voided pieces of clot. Will proceed with CT urogram to further evaluate upper urinary tract. He may need a repeat cystoscopy however will do this either with nitrous in the office or valium.

## 2021-04-28 NOTE — Transfer of Care (Signed)
Immediate Anesthesia Transfer of Care Note  Patient: Dustin Ruiz  Procedure(s) Performed: TRANSURETHRAL RESECTION OF BLADDER TUMOR CYSTOSCOPY, LEFT URETERAL STENT PLACEMENT AND LEFT RETROGRADE  Patient Location: PACU  Anesthesia Type:General  Level of Consciousness: awake, alert , oriented and patient cooperative  Airway & Oxygen Therapy: Patient Spontanous Breathing  Post-op Assessment: Report given to RN and Post -op Vital signs reviewed and stable  Post vital signs: Reviewed and stable  Last Vitals:  Vitals Value Taken Time  BP 148/98 04/28/21 1313  Temp 36.4 C 04/28/21 1313  Pulse 14 04/28/21 1313  Resp 14 04/28/21 1313  SpO2 98 % 04/28/21 1313    Last Pain:  Vitals:   04/28/21 1313  TempSrc:   PainSc: 0-No pain      Patients Stated Pain Goal: 5 ([id number])  Complications: No notable events documented.

## 2021-04-28 NOTE — Anesthesia Procedure Notes (Signed)
Procedure Name: LMA Insertion Date/Time: 04/28/2021 10:49 AM Performed by: Rogers Blocker, CRNA Pre-anesthesia Checklist: Patient identified, Emergency Drugs available, Suction available and Patient being monitored Patient Re-evaluated:Patient Re-evaluated prior to induction Oxygen Delivery Method: Circle system utilized Preoxygenation: Pre-oxygenation with 100% oxygen Induction Type: IV induction Ventilation: Mask ventilation without difficulty LMA: LMA inserted LMA Size: 5.0 Tube type: Oral Number of attempts: 1 Placement Confirmation: positive ETCO2 and breath sounds checked- equal and bilateral Tube secured with: Tape Dental Injury: Teeth and Oropharynx as per pre-operative assessment

## 2021-04-28 NOTE — Op Note (Signed)
PATIENT:  OLGA NORSWORTHY  PRE-OPERATIVE DIAGNOSIS: Bladder tumor  POST-OPERATIVE DIAGNOSIS: Same  PROCEDURE:   1. Transurethral resection bladder tumor >2cm 2.  Left retrograde pyelogram 3.  Left ureteral stent placement 4.8Fr x 26cm no tether  SURGEON:  Jacalyn Lefevre, MD  ANESTHESIA:   General  EBL:  Minimal  DRAINS: 4.8Fr x 26cm JJ stent no string  SPECIMEN:  Bladder tumor  DISPOSITION OF SPECIMEN:  PATHOLOGY  FINDINGS: Normal anterior urethra Lateral lobe prostatic hypertrophy without intravesical median lobe Orthotopic right ureteral orifice Greater than 2 cm papillary bladder mass seen overlying expected area of left ureteral orifice and trigone No other bladder tumor seen Retrograde pyelogram without filling defect or hydronephrosis however ureter does deviate medially  Indication: 55 year old man who presented with gross hematuria found to have bladder tumor on imaging.  Description of operation: The patient was taken to the operating room and administered general anesthesia. They were then placed on the table and moved to the dorsal lithotomy position after which the genitalia was sterilely prepped and draped. An official timeout was then performed.  The 70 French resectoscope with the 30 lens and visual obturator were then passed into the bladder under direct visualization. Urethra appeared normal. The visual obturator was then removed and the Gyrus resectoscope element with 30  lens was then inserted and the bladder was fully and systematically inspected. Ureteral orifices were noted to be in the normal anatomic positions.   I first began by resecting the papillary bladder mass seen overlying the left ureteral orifice.  This was done with bipolar loop.  The opening to the ureteral orifice was visualized and cannulated with a 0.38 sensor wire.  After the resection was complete a retrograde pyelogram was then obtained.  This showed that the wire was in proper position.   Given the resection of the left ureteral orifice decision was made to leave the left ureteral stent.  Attempts at placing a 6 [place] by 26 cm ureteral stent over a 0.38 sensor wire were unsuccessful due to resistance in the mid to proximal ureter.  A 4.8 French by 26 cm stent was then placed without difficulty using standard Seldinger technique.  Fluoroscopy confirmed curl in the kidney.  Reinspection of the bladder revealed all obvious tumor had been fully resected and there was no evidence of perforation. The Daylene Posey was then used to irrigate the bladder and remove all of the portions of bladder tumor which were sent to pathology. I then removed the resectoscope.  The patient was awakened and taken to the recovery room.   PLAN OF CARE: Discharge to home after PACU. Stent will remain in place for 2 weeks.   PATIENT DISPOSITION:  PACU - hemodynamically stable.

## 2021-04-28 NOTE — Interval H&P Note (Signed)
History and Physical Interval Note:  04/28/2021 10:09 AM  [place]  has presented today for surgery, with the diagnosis of BLADDER CANCER.  The various methods of treatment have been discussed with the patient and family. After consideration of risks, benefits and other options for treatment, the patient has consented to  Procedure(s) with comments: TRANSURETHRAL RESECTION OF BLADDER TUMOR WITH GEMCITABINE (N/A) - 1 HR as a surgical intervention.  The patient's history has been reviewed, patient examined, no change in status, stable for surgery.  I have reviewed the patient's chart and labs.  Questions were answered to the patient's satisfaction.     Davidson Palmieri D Ardyth Kelso

## 2021-04-28 NOTE — Anesthesia Preprocedure Evaluation (Signed)
Anesthesia Evaluation  Patient identified by MRN, date of birth, ID band Patient awake    Reviewed: Allergy & Precautions, NPO status , Patient's Chart, lab work & pertinent test results  History of Anesthesia Complications Negative for: history of anesthetic complications  Airway Mallampati: II  TM Distance: >3 FB Neck ROM: Full    Dental  (+) Dental Advisory Given, Upper Dentures, Lower Dentures   Pulmonary Current Smoker,    Pulmonary exam normal        Cardiovascular hypertension, Pt. on medications Normal cardiovascular exam  HLD   Neuro/Psych  Headaches, Anxiety Depression    GI/Hepatic negative GI ROS, Neg liver ROS,   Endo/Other  negative endocrine ROS  Renal/GU negative Renal ROS   BLADDER CANCER    Musculoskeletal  (+) Arthritis ,   Abdominal   Peds  Hematology negative hematology ROS (+)   Anesthesia Other Findings   Reproductive/Obstetrics                            Anesthesia Physical Anesthesia Plan  ASA: 2  Anesthesia Plan: General   Post-op Pain Management:    Induction: Intravenous  PONV Risk Score and Plan: 1 and Ondansetron, Dexamethasone, Midazolam and Treatment may vary due to age or medical condition  Airway Management Planned: LMA  Additional Equipment: None  Intra-op Plan:   Post-operative Plan: Extubation in OR  Informed Consent: I have reviewed the patients History and Physical, chart, labs and discussed the procedure including the risks, benefits and alternatives for the proposed anesthesia with the patient or authorized representative who has indicated his/her understanding and acceptance.     Dental advisory given  Plan Discussed with:   Anesthesia Plan Comments:         Anesthesia Quick Evaluation

## 2021-04-28 NOTE — Anesthesia Postprocedure Evaluation (Signed)
Anesthesia Post Note  Patient: Dustin Ruiz  Procedure(s) Performed: TRANSURETHRAL RESECTION OF BLADDER TUMOR CYSTOSCOPY, LEFT URETERAL STENT PLACEMENT AND LEFT RETROGRADE     Patient location during evaluation: PACU Level of consciousness: awake and alert Pain management: pain level controlled Vital Signs Assessment: post-procedure vital signs reviewed and stable Respiratory status: spontaneous breathing, nonlabored ventilation and respiratory function stable Cardiovascular status: blood pressure returned to baseline and stable Postop Assessment: no apparent nausea or vomiting Anesthetic complications: no   No notable events documented.  Last Vitals:  Vitals:   04/28/21 1215 04/28/21 1216  BP: 139/87 132/81  Pulse: 88 82  Resp: 12 16  Temp:    SpO2: 97% 97%    Last Pain:  Vitals:   04/28/21 1214  TempSrc:   PainSc: 3                  Lidia Collum

## 2021-04-28 NOTE — Discharge Instructions (Addendum)
Transurethral Resection of Bladder Tumor (TURBT)   Definition:  Transurethral Resection of the Bladder Tumor is a surgical procedure used to diagnose and remove tumors within the bladder. TURBT is the most common treatment for early stage bladder cancer.  General instructions:     Your recent bladder surgery requires very little post hospital care but some definite precautions.  Despite the fact that no skin incisions were used, the area around the bladder incisions are raw and covered with scabs to promote healing and prevent bleeding. Certain precautions are needed to insure that the scabs are not disturbed over the next 2-4 weeks while the healing proceeds.  Because the raw surface inside your bladder and the irritating effects of urine you may expect frequency of urination and/or urgency (a stronger desire to urinate) and perhaps even getting up at night more often. This will usually resolve or improve slowly over the healing period. You may see some blood in your urine over the first 6 weeks. Do not be alarmed, even if the urine was clear for a while. Get off your feet and drink lots of fluids until clearing occurs. If you start to pass clots or don't improve call us.  Ureteral stent: You have a ureteral stent placed on the left side which is the tube going from the kidney down to the bladder.  This was left in place due to the bladder tumor overlying the opening.  This will allow the kidney to drain while everything heals.  The stent will stay in place for 2 weeks.  Ureteral stents can cause overactive bladder type symptoms as well as blood in the urine.  This is normal and to be expected.  The stent will be removed in the office.  Diet:  You may return to your normal diet immediately. Because of the raw surface of your bladder, alcohol, spicy foods, foods high in acid and drinks with caffeine may cause irritation or frequency and should be used in moderation. To keep your urine flowing  freely and avoid constipation, drink plenty of fluids during the day (8-10 glasses). Tip: Avoid cranberry juice because it is very acidic.  Activity:  Your physical activity doesn't need to be restricted. However, if you are very active, you may see some blood in the urine. We suggest that you reduce your activity under the circumstances until the bleeding has stopped.  Bowels:  It is important to keep your bowels regular during the postoperative period. Straining with bowel movements can cause bleeding. A bowel movement every other day is reasonable. Use a mild laxative if needed, such as milk of magnesia 2-3 tablespoons, or 2 Dulcolax tablets. Call if you continue to have problems. If you had been taking narcotics for pain, before, during or after your surgery, you may be constipated. Take a laxative if necessary.    Medication:  You should resume your pre-surgery medications unless told not to. In addition you may be given an antibiotic to prevent or treat infection. Antibiotics are not always necessary. All medication should be taken as prescribed until the bottles are finished unless you are having an unusual reaction to one of the drugs.   Post Anesthesia Home Care Instructions  Activity: Get plenty of rest for the remainder of the day. A responsible individual must stay with you for 24 hours following the procedure.  For the next 24 hours, DO NOT: -Drive a car -Paediatric nurse -Drink alcoholic beverages -Take any medication unless instructed by your physician -Make any  legal decisions or sign important papers.  Meals: Start with liquid foods such as gelatin or soup. Progress to regular foods as tolerated. Avoid greasy, spicy, heavy foods. If nausea and/or vomiting occur, drink only clear liquids until the nausea and/or vomiting subsides. Call your physician if vomiting continues.  Special Instructions/Symptoms: Your throat may feel dry or sore from the anesthesia or the  breathing tube placed in your throat during surgery. If this causes discomfort, gargle with warm salt water. The discomfort should disappear within 24 hours.  Work Excuse 04/28/21 To Whom It May Concern,  Please excuse Mr. Redburn from work for the following time period: 04/24/21-05/12/21.  He will be unable to work during this time.  Please  address all questions to me.  Sincerely,  Jacalyn Lefevre, MD [facility] [phone]

## 2021-04-29 ENCOUNTER — Encounter (HOSPITAL_BASED_OUTPATIENT_CLINIC_OR_DEPARTMENT_OTHER): Payer: Self-pay | Admitting: Urology

## 2021-04-29 LAB — SURGICAL PATHOLOGY

## 2021-04-30 ENCOUNTER — Other Ambulatory Visit: Payer: Self-pay | Admitting: Urology

## 2021-04-30 MED ORDER — OXYBUTYNIN CHLORIDE 5 MG PO TABS: 5.0000 mg | ORAL_TABLET | Freq: Three times a day (TID) | ORAL | 0 refills | Status: DC | PRN

## 2021-04-30 MED ORDER — KETOROLAC TROMETHAMINE 10 MG PO TABS: 10.0000 mg | ORAL_TABLET | Freq: Four times a day (QID) | ORAL | 0 refills | Status: DC | PRN

## 2021-05-13 ENCOUNTER — Other Ambulatory Visit: Payer: Self-pay | Admitting: Urology

## 2021-06-04 ENCOUNTER — Other Ambulatory Visit: Payer: Self-pay

## 2021-06-04 ENCOUNTER — Encounter (HOSPITAL_BASED_OUTPATIENT_CLINIC_OR_DEPARTMENT_OTHER): Payer: Self-pay | Admitting: Urology

## 2021-06-04 NOTE — Progress Notes (Signed)
Spoke w/ via phone for pre-op interview---pt Lab needs dos----  I stat             Lab results------ekg 04-28-2021 chart/epic COVID test -----patient states asymptomatic no test needed Arrive at -------1100 am 06-09-2021 NPO after MN NO Solid Food.  Clear liquids from MN until---1000 am Med rec completed Medications to take morning of surgery -----alprazolam prn, amlodipine, bupropion, pyridium prn Diabetic medication -----n/a Patient instructed no nail polish to be worn day of surgery Patient instructed to bring photo id and insurance card day of surgery Patient aware to have Driver (ride ) / caregiver  wife Dustin Ruiz  for 24 hours after surgery  Patient Special Instructions -----none Pre-Op special Istructions -----none Patient verbalized understanding of instructions that were given at this phone interview. Patient denies shortness of breath, chest pain, fever, cough at this phone interview.

## 2021-06-08 NOTE — H&P (Signed)
CC/HPI: cc: bladder cancer   03/27/21: 55 year old man referred for painless gross hematuria. He is unsure if he has a family history of bladder cancer. He has a maternal aunt with bladder issues. He has significant tobacco history with 1.5 packs per day times 40 years. He is also being referred to Hematology-Oncology for an elevated white blood cell count. He has been passing clots for proximally 2 weeks now. He is on pyridium to help with painful urination. His urinalysis today shows rare bacteria.   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 His left ureteral orifice was resected and he has a left ureteral stent in place.     ALLERGIES: No Known Drug Allergies    MEDICATIONS: Ketorolac Tromethamine 10 mg tablet TAKE 1 TABLET BY MOUTH EVERY 6 HOURS AS NEEDED  Oxybutynin Chloride 5 mg tablet TAKE 1 TABLET BY MOUTH THREE TIMES A DAY AS NEEDED FOR BLADDER SPASMS  Phenazopyridine Hcl  Amlodipine Besilate  Ferrocite  Tramadol Hcl 50 mg tablet 1 tablet PO Q 6 H PRN prn post surgical pain  Xanax     GU PSH: Cystoscopy - 03/27/2021 Cystoscopy Insert Stent - 04/28/2021 Locm 300-399Mg /Ml Iodine,1Ml - 04/01/2021     NON-GU PSH: Anesth, Tympanotomy Knee Arthroscopy Tonsillectomy     GU PMH: Bladder Cancer Trigone - 05/08/2021, - 05/07/2021 Urinary Retention - 05/08/2021 Gross hematuria - 05/07/2021, - 04/01/2021, - 03/27/2021 Renal calculus    NON-GU PMH: Allergy, unspecified, initial encounter Anemia, unspecified Anxiety Arthritis Arthropathy, unspecified Hemoptysis Hypercholesterolemia Hyperlipidemia, unspecified Hypertension Other malaise Tension-type headache, unspecified, not intractable Tobacco use    FAMILY HISTORY: 1 Daughter - Runs in Family 1 son - Runs in Family Alzheimer's Disease - Runs in Family Cancer -  Father Diabetes - Runs in Family Heart Attack - Runs in Family Heart Disease - Runs in Family Hypertension - Runs in Family leukemia - Father Lung Cancer - Grandfather Stroke Syndrome - Runs in Family   SOCIAL HISTORY: Marital Status: Married Preferred Language: English; Ethnicity: Not Hispanic Or Latino; Race: White Current Smoking Status: Patient does not smoke anymore.   Tobacco Use Assessment Completed: Used Tobacco in last 30 days? Does not use smokeless tobacco. Does drink.  Does not use drugs. Drinks 4+ caffeinated drinks per day.    REVIEW OF SYSTEMS:    GU Review Male:   Patient denies frequent urination, hard to postpone urination, burning/ pain with urination, get up at night to urinate, leakage of urine, stream starts and stops, trouble starting your stream, have to strain to urinate , erection problems, and penile pain.  Gastrointestinal (Upper):   Patient denies nausea, vomiting, and indigestion/ heartburn.  Gastrointestinal (Lower):   Patient denies diarrhea and constipation.  Constitutional:   Patient denies fever, night sweats, weight loss, and fatigue.  Skin:   Patient denies skin rash/ lesion and itching.  Eyes:   Patient denies blurred vision and double vision.  Ears/ Nose/ Throat:   Patient denies sore throat and sinus problems.  Hematologic/Lymphatic:   Patient denies swollen glands and easy bruising.  Cardiovascular:   Patient denies leg swelling and chest pains.  Respiratory:   Patient denies cough and shortness of breath.  Endocrine:   Patient denies excessive thirst.  Musculoskeletal:   Patient denies back pain and joint pain.  Neurological:   Patient denies headaches and dizziness.  Psychologic:   Patient denies depression and anxiety.   VITAL SIGNS: None   MULTI-SYSTEM PHYSICAL EXAMINATION:    Constitutional: Well-nourished. No physical deformities. Normally developed. Good grooming.  Neck: Neck symmetrical, not swollen. Normal tracheal position.   Respiratory: No labored breathing, no use of accessory muscles.   Skin: No paleness, no jaundice, no cyanosis. No lesion, no ulcer, no rash.  Neurologic / Psychiatric: Oriented to time, oriented to place, oriented to person. No depression, no anxiety, no agitation.  Gastrointestinal: No rigidity, non obese abdomen.   Eyes: Normal conjunctivae. Normal eyelids.  Ears, Nose, Mouth, and Throat: Left ear no scars, no lesions, no masses. Right ear no scars, no lesions, no masses. Nose no scars, no lesions, no masses. Normal hearing. Normal lips.  Musculoskeletal: Normal gait and station of head and neck.     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 At that time may remove the ureteral stent depending on what the resected ureteral orifice looks like. Patient to be given Macrobid to start 3 days prior to the procedure. In addition he will be given tamsulosin to help with more complete bladder emptying. Foley removed today. He does have catheter supplies in the event he is unable to emptying at home. He will be scheduled for surgery as discussed.

## 2021-06-09 ENCOUNTER — Ambulatory Visit (HOSPITAL_BASED_OUTPATIENT_CLINIC_OR_DEPARTMENT_OTHER): Payer: BC Managed Care – PPO | Admitting: Anesthesiology

## 2021-06-09 ENCOUNTER — Ambulatory Visit (HOSPITAL_BASED_OUTPATIENT_CLINIC_OR_DEPARTMENT_OTHER)
Admission: RE | Admit: 2021-06-09 | Discharge: 2021-06-09 | Disposition: A | Discharge status: Home / Self Care | Payer: BC Managed Care – PPO | Attending: Urology | Admitting: Urology

## 2021-06-09 ENCOUNTER — Other Ambulatory Visit: Payer: Self-pay

## 2021-06-09 ENCOUNTER — Encounter (HOSPITAL_BASED_OUTPATIENT_CLINIC_OR_DEPARTMENT_OTHER): Payer: Self-pay | Admitting: Urology

## 2021-06-09 ENCOUNTER — Encounter (HOSPITAL_BASED_OUTPATIENT_CLINIC_OR_DEPARTMENT_OTHER)
Admission: RE | Disposition: A | Discharge status: Home / Self Care | Payer: Self-pay | Source: Home / Self Care | Attending: Urology

## 2021-06-09 DIAGNOSIS — Z806 Family history of leukemia: Secondary | ICD-10-CM | POA: Insufficient documentation

## 2021-06-09 DIAGNOSIS — Z801 Family history of malignant neoplasm of trachea, bronchus and lung: Secondary | ICD-10-CM | POA: Diagnosis not present

## 2021-06-09 DIAGNOSIS — C670 Malignant neoplasm of trigone of bladder: Secondary | ICD-10-CM | POA: Diagnosis not present

## 2021-06-09 DIAGNOSIS — R339 Retention of urine, unspecified: Secondary | ICD-10-CM | POA: Diagnosis not present

## 2021-06-09 DIAGNOSIS — Z809 Family history of malignant neoplasm, unspecified: Secondary | ICD-10-CM | POA: Insufficient documentation

## 2021-06-09 DIAGNOSIS — Z87891 Personal history of nicotine dependence: Secondary | ICD-10-CM | POA: Insufficient documentation

## 2021-06-09 HISTORY — PX: CYSTOSCOPY WITH RETROGRADE PYELOGRAM, URETEROSCOPY AND STENT PLACEMENT: SHX5789

## 2021-06-09 HISTORY — PX: TRANSURETHRAL RESECTION OF BLADDER TUMOR: SHX2575

## 2021-06-09 LAB — POCT I-STAT, CHEM 8
BUN: 12 mg/dL (ref 6–20)
Calcium, Ion: 1.26 mmol/L (ref 1.15–1.40)
Chloride: 104 mmol/L (ref 98–111)
Creatinine, Ser: 0.8 mg/dL (ref 0.61–1.24)
Glucose, Bld: 98 mg/dL (ref 70–99)
HCT: 42 % (ref 39.0–52.0)
Hemoglobin: 14.3 g/dL (ref 13.0–17.0)
Potassium: 4.2 mmol/L (ref 3.5–5.1)
Sodium: 142 mmol/L (ref 135–145)
TCO2: 25 mmol/L (ref 22–32)

## 2021-06-09 SURGERY — TURBT (TRANSURETHRAL RESECTION OF BLADDER TUMOR)
Anesthesia: General | Site: Pelvis

## 2021-06-09 MED ORDER — FENTANYL CITRATE (PF) 100 MCG/2ML IJ SOLN
INTRAMUSCULAR | Status: AC
  Filled 2021-06-09: qty 2

## 2021-06-09 MED ORDER — DEXAMETHASONE SODIUM PHOSPHATE 10 MG/ML IJ SOLN
INTRAMUSCULAR | Status: AC
  Filled 2021-06-09: qty 1

## 2021-06-09 MED ORDER — TAMSULOSIN HCL 0.4 MG PO CAPS: 0.4000 mg | ORAL_CAPSULE | Freq: Every day | ORAL | 0 refills | Status: AC

## 2021-06-09 MED ORDER — DEXAMETHASONE SODIUM PHOSPHATE 10 MG/ML IJ SOLN
INTRAMUSCULAR | Status: DC | PRN
  Administered 2021-06-09: 5 mg via INTRAVENOUS

## 2021-06-09 MED ORDER — ACETAMINOPHEN 10 MG/ML IV SOLN: 1000.0000 mg | Freq: Once | INTRAVENOUS | Status: DC | PRN

## 2021-06-09 MED ORDER — TRAMADOL HCL 50 MG PO TABS: 50.0000 mg | ORAL_TABLET | Freq: Four times a day (QID) | ORAL | 0 refills | Status: AC | PRN

## 2021-06-09 MED ORDER — MIDAZOLAM HCL 5 MG/5ML IJ SOLN
INTRAMUSCULAR | Status: DC | PRN
  Administered 2021-06-09: 2 mg via INTRAVENOUS

## 2021-06-09 MED ORDER — ONDANSETRON HCL 4 MG/2ML IJ SOLN
INTRAMUSCULAR | Status: AC
  Filled 2021-06-09: qty 2

## 2021-06-09 MED ORDER — OXYCODONE HCL 5 MG/5ML PO SOLN: 5.0000 mg | Freq: Once | ORAL | Status: DC | PRN

## 2021-06-09 MED ORDER — SODIUM CHLORIDE 0.9 % IR SOLN
Status: DC | PRN
  Administered 2021-06-09 (×2): 6000 mL via INTRAVESICAL

## 2021-06-09 MED ORDER — LIDOCAINE 2% (20 MG/ML) 5 ML SYRINGE
INTRAMUSCULAR | Status: AC
  Filled 2021-06-09: qty 5

## 2021-06-09 MED ORDER — IOHEXOL 300 MG/ML  SOLN
INTRAMUSCULAR | Status: DC | PRN
  Administered 2021-06-09: 10 mL via URETHRAL

## 2021-06-09 MED ORDER — SUGAMMADEX SODIUM 200 MG/2ML IV SOLN
INTRAVENOUS | Status: DC | PRN
  Administered 2021-06-09: 200 mg via INTRAVENOUS

## 2021-06-09 MED ORDER — PHENAZOPYRIDINE HCL 200 MG PO TABS: 200.0000 mg | ORAL_TABLET | Freq: Three times a day (TID) | ORAL | 0 refills | Status: AC | PRN

## 2021-06-09 MED ORDER — ONDANSETRON HCL 4 MG/2ML IJ SOLN
INTRAMUSCULAR | Status: DC | PRN
Start: 2021-06-09 — End: 2021-06-09
  Administered 2021-06-09: 4 mg via INTRAVENOUS

## 2021-06-09 MED ORDER — LIDOCAINE 2% (20 MG/ML) 5 ML SYRINGE
INTRAMUSCULAR | Status: DC | PRN
  Administered 2021-06-09: 40 mg via INTRAVENOUS

## 2021-06-09 MED ORDER — FENTANYL CITRATE (PF) 100 MCG/2ML IJ SOLN: 25.0000 ug | INTRAMUSCULAR | Status: DC | PRN

## 2021-06-09 MED ORDER — FENTANYL CITRATE (PF) 100 MCG/2ML IJ SOLN
INTRAMUSCULAR | Status: DC | PRN
  Administered 2021-06-09 (×2): 50 ug via INTRAVENOUS
  Administered 2021-06-09: 100 ug via INTRAVENOUS

## 2021-06-09 MED ORDER — PROMETHAZINE HCL 25 MG/ML IJ SOLN: 6.2500 mg | INTRAMUSCULAR | Status: DC | PRN

## 2021-06-09 MED ORDER — LACTATED RINGERS IV SOLN: INTRAVENOUS | Status: DC

## 2021-06-09 MED ORDER — ACETAMINOPHEN 325 MG PO TABS: 325.0000 mg | ORAL_TABLET | ORAL | Status: DC | PRN

## 2021-06-09 MED ORDER — CEFAZOLIN SODIUM-DEXTROSE 2-4 GM/100ML-% IV SOLN
INTRAVENOUS | Status: AC
  Filled 2021-06-09: qty 100

## 2021-06-09 MED ORDER — ROCURONIUM BROMIDE 10 MG/ML (PF) SYRINGE
PREFILLED_SYRINGE | INTRAVENOUS | Status: DC | PRN
Start: 2021-06-09 — End: 2021-06-09
  Administered 2021-06-09: 50 mg via INTRAVENOUS
  Administered 2021-06-09: 20 mg via INTRAVENOUS

## 2021-06-09 MED ORDER — PROPOFOL 10 MG/ML IV BOLUS
INTRAVENOUS | Status: AC
  Filled 2021-06-09: qty 20

## 2021-06-09 MED ORDER — MIDAZOLAM HCL 2 MG/2ML IJ SOLN
INTRAMUSCULAR | Status: AC
  Filled 2021-06-09: qty 2

## 2021-06-09 MED ORDER — AMISULPRIDE (ANTIEMETIC) 5 MG/2ML IV SOLN
10.0000 mg | Freq: Once | INTRAVENOUS | Status: DC | PRN
Start: 2021-06-09 — End: 2021-06-09

## 2021-06-09 MED ORDER — OXYCODONE HCL 5 MG PO TABS
5.0000 mg | ORAL_TABLET | Freq: Once | ORAL | Status: DC | PRN
Start: 2021-06-09 — End: 2021-06-09

## 2021-06-09 MED ORDER — ACETAMINOPHEN 160 MG/5ML PO SOLN: 325.0000 mg | ORAL | Status: DC | PRN

## 2021-06-09 MED ORDER — CEFAZOLIN SODIUM-DEXTROSE 2-4 GM/100ML-% IV SOLN
2.0000 g | INTRAVENOUS | Status: AC
  Administered 2021-06-09: 2 g via INTRAVENOUS

## 2021-06-09 MED ORDER — PROPOFOL 10 MG/ML IV BOLUS
INTRAVENOUS | Status: DC | PRN
  Administered 2021-06-09: 130 mg via INTRAVENOUS

## 2021-06-09 SURGICAL SUPPLY — 33 items
BAG DRAIN URO-CYSTO SKYTR STRL (DRAIN) ×3 IMPLANT
BAG DRN RND TRDRP ANRFLXCHMBR (UROLOGICAL SUPPLIES) ×2
BAG DRN UROCATH (DRAIN) ×2
BAG URINE DRAIN 2000ML AR STRL (UROLOGICAL SUPPLIES) ×1 IMPLANT
BASKET ZERO TIP NITINOL 2.4FR (BASKET) IMPLANT
BSKT STON RTRVL ZERO TP 2.4FR (BASKET)
CATH FOLEY 2WAY SLVR  5CC 18FR (CATHETERS)
CATH FOLEY 2WAY SLVR  5CC 20FR (CATHETERS) ×3
CATH FOLEY 2WAY SLVR 5CC 18FR (CATHETERS) IMPLANT
CATH FOLEY 2WAY SLVR 5CC 20FR (CATHETERS) IMPLANT
CATH URET 5FR 28IN OPEN ENDED (CATHETERS) ×3 IMPLANT
CLOTH BEACON ORANGE TIMEOUT ST (SAFETY) ×3 IMPLANT
DRSG TEGADERM 4X4.75 (GAUZE/BANDAGES/DRESSINGS) IMPLANT
ELECT REM PT RETURN 9FT ADLT (ELECTROSURGICAL) ×3
ELECTRODE REM PT RTRN 9FT ADLT (ELECTROSURGICAL) ×2 IMPLANT
EXTRACTOR STONE 1.7FRX115CM (UROLOGICAL SUPPLIES) IMPLANT
GLOVE SURG ENC MOIS LTX SZ6.5 (GLOVE) ×3 IMPLANT
GOWN STRL REUS W/TWL LRG LVL3 (GOWN DISPOSABLE) ×3 IMPLANT
GUIDEWIRE STR DUAL SENSOR (WIRE) ×3 IMPLANT
HOLDER FOLEY CATH W/STRAP (MISCELLANEOUS) ×1 IMPLANT
IV NS IRRIG 3000ML ARTHROMATIC (IV SOLUTION) ×5 IMPLANT
KIT TURNOVER CYSTO (KITS) ×3 IMPLANT
LOOP CUT BIPOLAR 24F LRG (ELECTROSURGICAL) ×1 IMPLANT
MANIFOLD NEPTUNE II (INSTRUMENTS) ×3 IMPLANT
NS IRRIG 500ML POUR BTL (IV SOLUTION) ×1 IMPLANT
PACK CYSTO (CUSTOM PROCEDURE TRAY) ×3 IMPLANT
SYR TOOMEY IRRIG 70ML (MISCELLANEOUS) ×3
SYRINGE TOOMEY IRRIG 70ML (MISCELLANEOUS) ×2 IMPLANT
TRACTIP FLEXIVA PULS ID 200XHI (Laser) IMPLANT
TRACTIP FLEXIVA PULSE ID 200 (Laser)
TUBE CONNECTING 12X1/4 (SUCTIONS) ×3 IMPLANT
TUBING UROLOGY SET (TUBING) ×3 IMPLANT
WATER STERILE IRR 500ML POUR (IV SOLUTION) ×1 IMPLANT

## 2021-06-09 NOTE — Discharge Instructions (Addendum)
Transurethral Resection of Bladder Tumor (TURBT)   Definition:  Transurethral Resection of the Bladder Tumor is a surgical procedure used to diagnose and remove tumors within the bladder. TURBT is the most common treatment for early stage bladder cancer.  General instructions:     Your recent bladder surgery requires very little post hospital care but some definite precautions.  Despite the fact that no skin incisions were used, the area around the bladder incisions are raw and covered with scabs to promote healing and prevent bleeding. Certain precautions are needed to insure that the scabs are not disturbed over the next 2-4 weeks while the healing proceeds.  Because the raw surface inside your bladder and the irritating effects of urine you may expect frequency of urination and/or urgency (a stronger desire to urinate) and perhaps even getting up at night more often. This will usually resolve or improve slowly over the healing period. You may see some blood in your urine over the first 6 weeks. Do not be alarmed, even if the urine was clear for a while. Get off your feet and drink lots of fluids until clearing occurs. If you start to pass clots or don't improve call us.  Catheter: (If you are discharged with a catheter.)  1. Keep your catheter secured to your leg at all times with tape or the supplied strap. 2. You may experience leakage of urine around your catheter- as long as the  catheter continues to drain, this is normal.  If your catheter stops draining  go to the ER. 3. You may also have blood in your urine, even after it has been clear for  several days; you may even pass some small blood clots or other material.  This  is normal as well.  If this happens, sit down and drink plenty of water to help  make urine to flush out your bladder.  If the blood in your urine becomes worse  after doing this, contact our office or return to the ER. 4. You may use the leg bag (small bag)  during the day, but use the large bag at  night.  Diet:  You may return to your normal diet immediately. Because of the raw surface of your bladder, alcohol, spicy foods, foods high in acid and drinks with caffeine may cause irritation or frequency and should be used in moderation. To keep your urine flowing freely and avoid constipation, drink plenty of fluids during the day (8-10 glasses). Tip: Avoid cranberry juice because it is very acidic.  Activity:  Your physical activity doesn't need to be restricted. However, if you are very active, you may see some blood in the urine. We suggest that you reduce your activity under the circumstances until the bleeding has stopped.  Bowels:  It is important to keep your bowels regular during the postoperative period. Straining with bowel movements can cause bleeding. A bowel movement every other day is reasonable. Use a mild laxative if needed, such as milk of magnesia 2-3 tablespoons, or 2 Dulcolax tablets. Call if you continue to have problems. If you had been taking narcotics for pain, before, during or after your surgery, you may be constipated. Take a laxative if necessary.    Medication:  You should resume your pre-surgery medications unless told not to. In addition you may be given an antibiotic to prevent or treat infection. Antibiotics are not always necessary. All medication should be taken as prescribed until the bottles are finished unless you are having   an unusual reaction to one of the drugs.         Post Anesthesia Home Care Instructions  Activity: Get plenty of rest for the remainder of the day. A responsible individual must stay with you for 24 hours following the procedure.  For the next 24 hours, DO NOT: -Drive a car -Paediatric nurse -Drink alcoholic beverages -Take any medication unless instructed by your physician -Make any legal decisions or sign important papers.  Meals: Start with liquid foods such as gelatin  or soup. Progress to regular foods as tolerated. Avoid greasy, spicy, heavy foods. If nausea and/or vomiting occur, drink only clear liquids until the nausea and/or vomiting subsides. Call your physician if vomiting continues.  Special Instructions/Symptoms: Your throat may feel dry or sore from the anesthesia or the breathing tube placed in your throat during surgery. If this causes discomfort, gargle with warm salt water. The discomfort should disappear within 24 hours.

## 2021-06-09 NOTE — Anesthesia Postprocedure Evaluation (Signed)
Anesthesia Post Note  Patient: Dustin Ruiz  Procedure(s) Performed: RESTAGING TRANSURETHRAL RESECTION OF BLADDER TUMOR (TURBT) (Bladder) CYSTOSCOPY WITH RETROGRADE PYELOGRAM, DIAGNOSTIC URETEROSCOPY AND STENT REMOVAL (Left: Pelvis)     Patient location during evaluation: PACU Anesthesia Type: General Level of consciousness: awake and alert Pain management: pain level controlled Vital Signs Assessment: post-procedure vital signs reviewed and stable Respiratory status: spontaneous breathing, nonlabored ventilation, respiratory function stable and patient connected to nasal cannula oxygen Cardiovascular status: blood pressure returned to baseline and stable Postop Assessment: no apparent nausea or vomiting Anesthetic complications: no   No notable events documented.  Last Vitals:  Vitals:   06/09/21 1400 06/09/21 1415  BP: (!) 140/91 (!) 141/92  Pulse: 83 78  Resp: 13 12  Temp:  (!) 36.3 C  SpO2: 95% 94%    Last Pain:  Vitals:   06/09/21 1108  TempSrc: Oral                 Effie Berkshire

## 2021-06-09 NOTE — Anesthesia Procedure Notes (Signed)
Procedure Name: Intubation Date/Time: 06/09/2021 1:02 PM Performed by: Rogers Blocker, CRNA Pre-anesthesia Checklist: Patient identified, Emergency Drugs available, Suction available and Patient being monitored Patient Re-evaluated:Patient Re-evaluated prior to induction Oxygen Delivery Method: Circle System Utilized Preoxygenation: Pre-oxygenation with 100% oxygen Induction Type: IV induction Ventilation: Mask ventilation without difficulty Laryngoscope Size: Mac and 4 Grade View: Grade I Tube type: Oral Tube size: 7.5 mm Number of attempts: 1 Airway Equipment and Method: Stylet Placement Confirmation: ETT inserted through vocal cords under direct vision, positive ETCO2 and breath sounds checked- equal and bilateral Secured at: 21 cm Tube secured with: Tape Dental Injury: Teeth and Oropharynx as per pre-operative assessment

## 2021-06-09 NOTE — Anesthesia Preprocedure Evaluation (Addendum)
Anesthesia Evaluation  Patient identified by MRN, date of birth, ID band Patient awake    Reviewed: Allergy & Precautions, NPO status , Patient's Chart, lab work & pertinent test results  Airway Mallampati: I  TM Distance: >3 FB Neck ROM: Full    Dental  (+) Dental Advisory Given, Upper Dentures, Lower Dentures   Pulmonary Current Smoker,    breath sounds clear to auscultation       Cardiovascular hypertension, Pt. on medications  Rhythm:Regular Rate:Normal     Neuro/Psych  Headaches, PSYCHIATRIC DISORDERS Anxiety Depression    GI/Hepatic negative GI ROS, Neg liver ROS,   Endo/Other  negative endocrine ROS  Renal/GU Renal disease     Musculoskeletal  (+) Arthritis ,   Abdominal Normal abdominal exam  (+)   Peds  Hematology negative hematology ROS (+)   Anesthesia Other Findings   Reproductive/Obstetrics                            Anesthesia Physical Anesthesia Plan  ASA: 2  Anesthesia Plan: General   Post-op Pain Management:    Induction: Intravenous  PONV Risk Score and Plan: 2 and Ondansetron, Dexamethasone and Midazolam  Airway Management Planned: LMA  Additional Equipment: None  Intra-op Plan:   Post-operative Plan: Extubation in OR  Informed Consent: I have reviewed the patients History and Physical, chart, labs and discussed the procedure including the risks, benefits and alternatives for the proposed anesthesia with the patient or authorized representative who has indicated his/her understanding and acceptance.     Dental advisory given  Plan Discussed with: CRNA  Anesthesia Plan Comments:        Anesthesia Quick Evaluation

## 2021-06-09 NOTE — Interval H&P Note (Signed)
History and Physical Interval Note:  06/09/2021 12:19 PM  Noma  has presented today for surgery, with the diagnosis of BLADDER CANCER.  The various methods of treatment have been discussed with the patient and family. After consideration of risks, benefits and other options for treatment, the patient has consented to  Procedure(s) with comments: RESTAGING TRANSURETHRAL RESECTION OF BLADDER TUMOR (TURBT) (N/A) - 75 MINS CYSTOSCOPY WITH RETROGRADE PYELOGRAM, DIAGNOSTIC URETEROSCOPY AND STENT EXCHANGE (Left) as a surgical intervention.  The patient's history has been reviewed, patient examined, no change in status, stable for surgery.  I have reviewed the patient's chart and labs.  Questions were answered to the patient's satisfaction.     Kaysan Peixoto D Lucresia Simic

## 2021-06-09 NOTE — Transfer of Care (Signed)
Immediate Anesthesia Transfer of Care Note  Patient: Dustin Ruiz  Procedure(s) Performed: RESTAGING TRANSURETHRAL RESECTION OF BLADDER TUMOR (TURBT) (Bladder) CYSTOSCOPY WITH RETROGRADE PYELOGRAM, DIAGNOSTIC URETEROSCOPY AND STENT REMOVAL (Left: Pelvis)  Patient Location: PACU  Anesthesia Type:General  Level of Consciousness: awake, alert  and oriented  Airway & Oxygen Therapy: Patient Spontanous Breathing and Patient connected to nasal cannula oxygen  Post-op Assessment: Report given to RN and Post -op Vital signs reviewed and stable  Post vital signs: Reviewed and stable  Last Vitals:  Vitals Value Taken Time  BP 154/85 06/09/21 1345  Temp    Pulse 92 06/09/21 1349  Resp 15 06/09/21 1349  SpO2 98 % 06/09/21 1349  Vitals shown include unvalidated device data.  Last Pain:  Vitals:   06/09/21 1108  TempSrc: Oral         Complications: No notable events documented.

## 2021-06-09 NOTE — Op Note (Signed)
PATIENT:  Dustin Ruiz  PRE-OPERATIVE DIAGNOSIS: bladder cancer  POST-OPERATIVE DIAGNOSIS: bladder cancer  PROCEDURE:   1. Retaging TURBT 2. Diagnostic left ureteroscopy and retrograde peylogram 3. Removal of left ureteral stent   SURGEON:  Jacalyn Lefevre, MD  ANESTHESIA:   General  EBL:  Minimal  DRAINS: Urethral catheter (20 Fr. 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  Description of operation: The patient was taken to the operating room and administered general anesthesia. They were then placed on the table and moved to the dorsal lithotomy position after which the genitalia was sterilely prepped and draped. An official timeout was then performed.  The 23 French rigid cystoscope was placed in the urethral meatus and advanced in the bladder and direct visualization.  The existing left ureteral stent was seen emanating from the left ureteral orifice.  It was grabbed with the graspers and removed.  Next an open-ended ureteral catheter was advanced into the wide open ureteral orifice and a retrograde pyelogram was obtained.  There was no filling defect noted.  A wire was then placed through the open-ended ureteral catheter and the open-ended ureteral catheter was removed.  Semirigid ureteroscopy then took place alongside the guidewire into the ureter and advanced to the proximal ureter.  No tumor or obstruction was seen.  The ureteroscope was removed.    Next examination of the bladder took place.  There was erythema most prominent in the right and left lateral walls.  It was difficult to  say if this was residual carcinoma versus erythema from the stent being in place.  A cold cup bladder biopsy was taken from the right lateral wall and the left lateral wall.  The 10 French resectoscope with the 30 lens and visual obturator were then passed into the bladder under direct visualization. Urethra appeared normal. The visual obturator was then removed and the Gyrus resectoscope element with 30  lens was then inserted.  The bipolar loop was then used to cauterize the biopsy sites and all remaining erythema.  Hemostasis was deemed adequate with irrigant turned off.  The patient's bladder was decompressed.  A 20 French Foley catheter was then placed at the end of the case.  The patient was awakened and taken to the recovery room.   PLAN OF CARE: Discharge to home after PACU with foley in place.   PATIENT DISPOSITION:  PACU - hemodynamically stable.

## 2021-06-10 ENCOUNTER — Encounter (HOSPITAL_BASED_OUTPATIENT_CLINIC_OR_DEPARTMENT_OTHER): Payer: Self-pay | Admitting: Urology

## 2021-06-10 LAB — SURGICAL PATHOLOGY

## 2021-10-04 IMAGING — CT CT ANGIO CHEST
2 of 6 series · 18 of 36 positions shown · IV contrast (omnipaque)
Comparison: CT of the abdomen and pelvis on 12/01/2018 fell, chest
x-ray 05/23/2020

CLINICAL DATA: Suspect pulmonary embolus. Positive D-dimer.
Coughing up blood today.

EXAM:
CT ANGIOGRAPHY CHEST WITH CONTRAST
TECHNIQUE: Multidetector CT imaging of the chest was performed using the
standard protocol during bolus administration of intravenous
contrast. Multiplanar CT image reconstructions and MIPs were
obtained to evaluate the vascular anatomy.
CONTRAST:  100mL OMNIPAQUE IOHEXOL 350 MG/ML SOLN

[Series 5: thins · axial · 0.74mm/px · z∈[-310,-31]mm · 17 of 315 slices shown]
[im 18/315  lung]
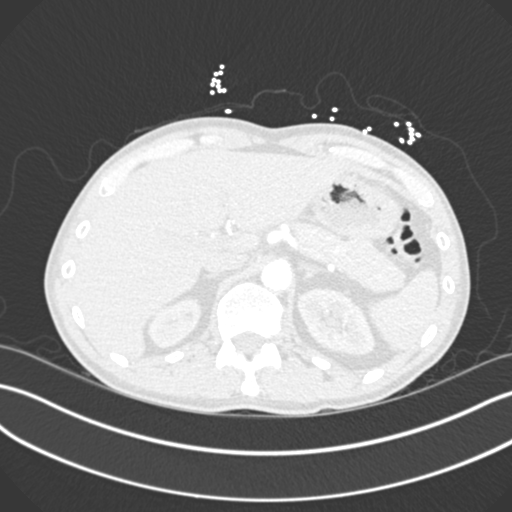
[im 35/315  mediastinal]
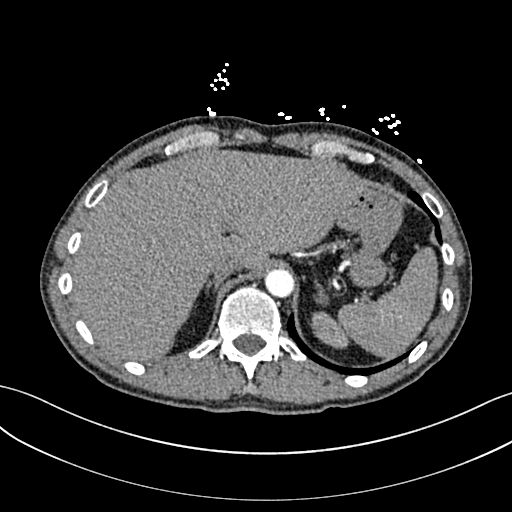
[im 53/315  lung]
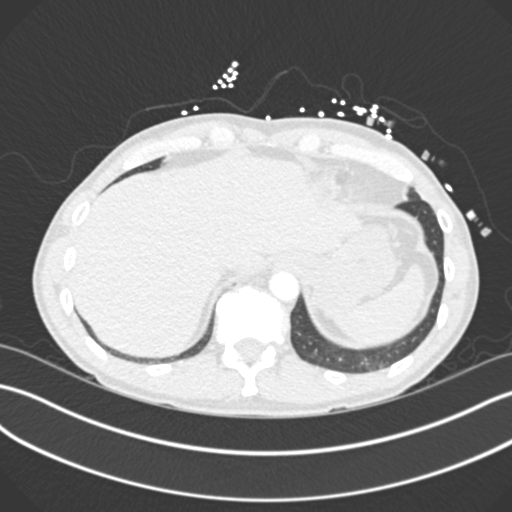
[im 70/315  mediastinal]
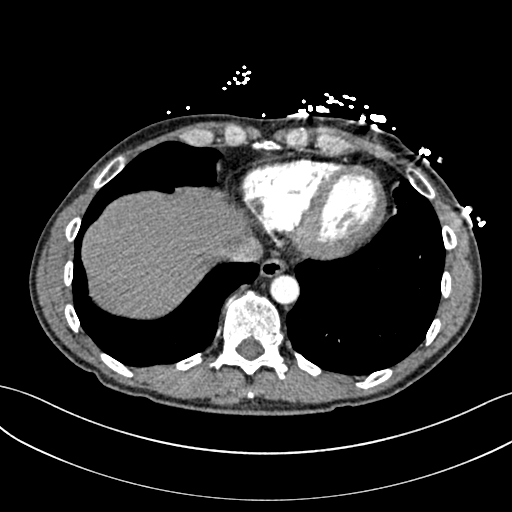
[im 88/315  lung]
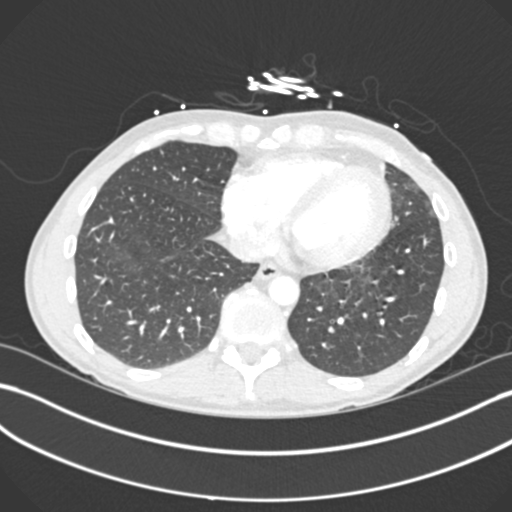
[im 105/315  mediastinal]
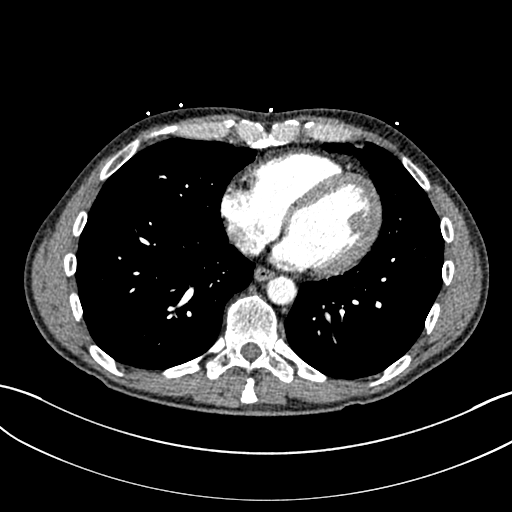
[im 123/315  lung]
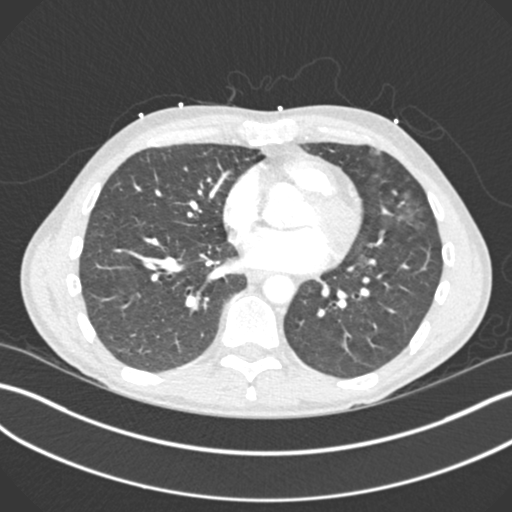
[im 140/315  mediastinal]
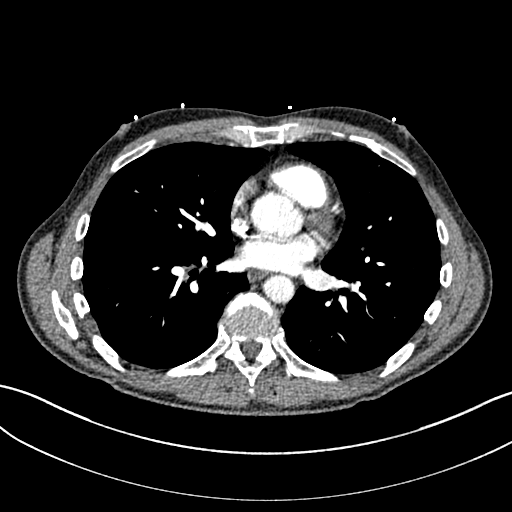
[im 158/315  lung]
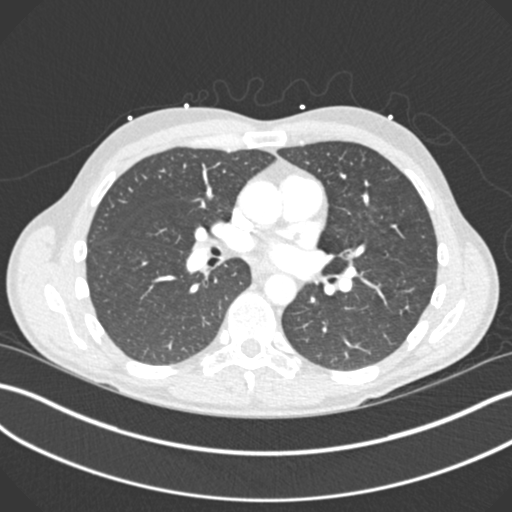
[im 175/315  mediastinal]
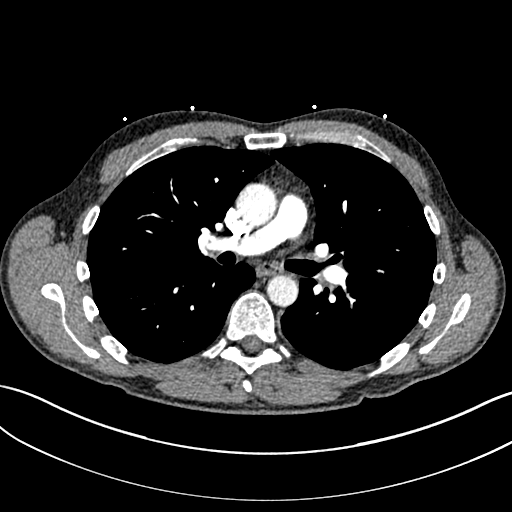
[im 192/315  lung]
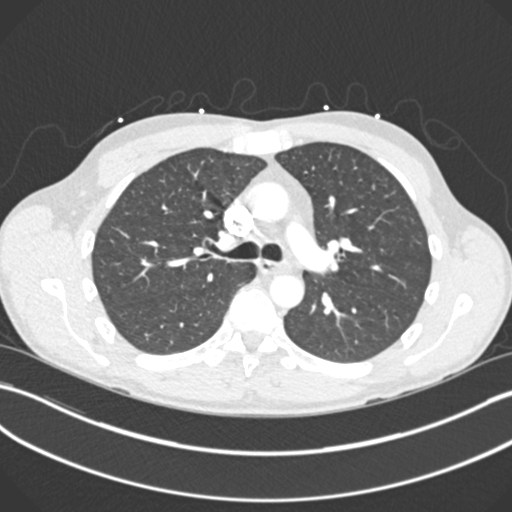
[im 210/315  mediastinal]
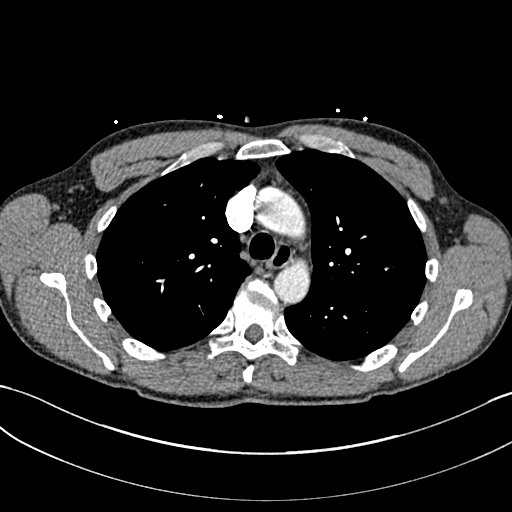
[im 227/315  lung]
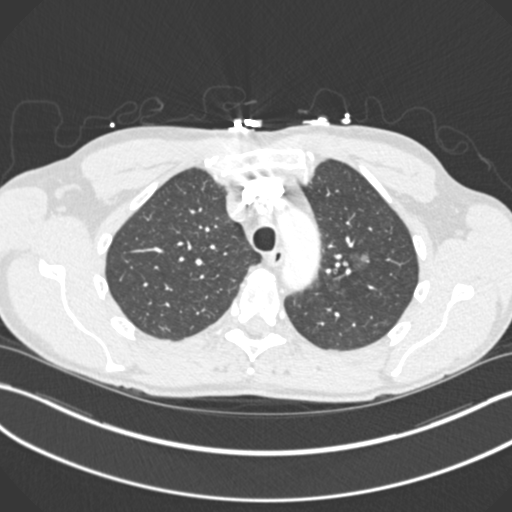
[im 245/315  mediastinal]
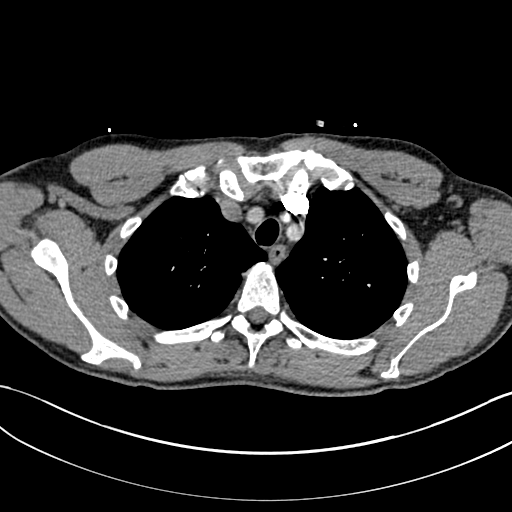
[im 262/315  lung]
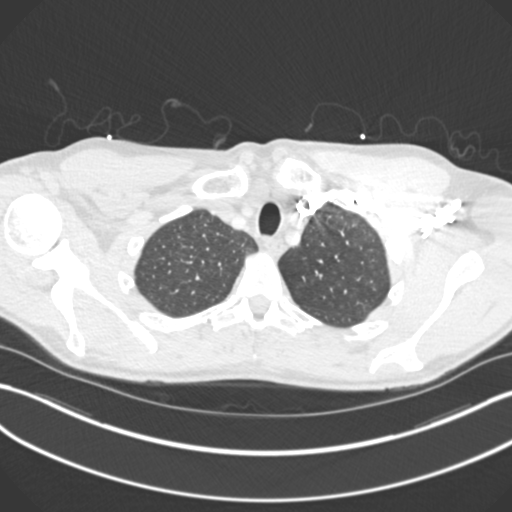
[im 280/315  mediastinal]
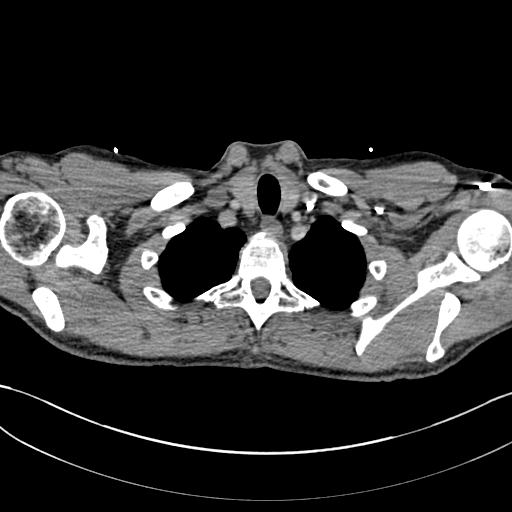
[im 297/315  lung]
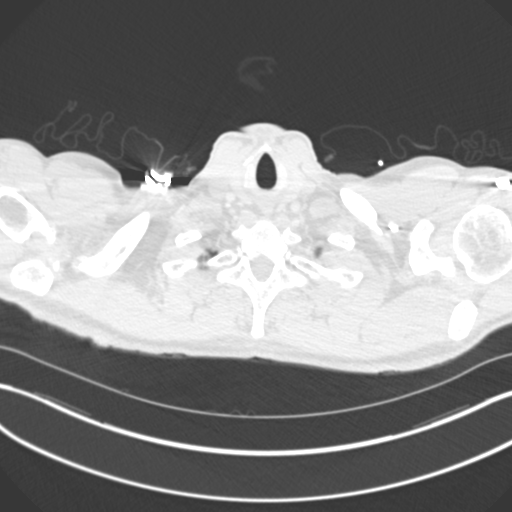

[Series 7: coronal mpr · coronal · 0.64mm/px · 1 of 150 slices shown]
[im 75/150  mediastinal]
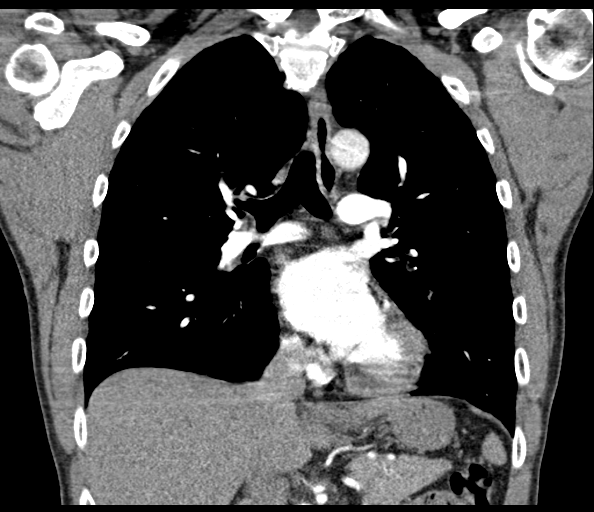

[18 of 36 positions shown; findings below may reference images not displayed]

FINDINGS: Cardiovascular: Satisfactory opacification of the pulmonary arteries
to the segmental level. No evidence of pulmonary embolism. Normal
heart size. No pericardial effusion.

Mediastinum/Nodes: No enlarged mediastinal, hilar, or axillary lymph
nodes. Thyroid gland, trachea, and esophagus demonstrate no
significant findings.

Lungs/Pleura: Within the LEFT UPPER lobe, there is a part solid mass
best seen on images 39 and 40 of series 6. This solid component is 6
millimeters. The ground-glass of component is 2.0 x 1.3 centimeters.
Other smaller ground-glass opacities are identified within the
lingula and LEFT LOWER lobe.

The RIGHT lung is clear. There are no pleural effusions. No
consolidations. The airways are patent.

Upper Abdomen: 1.2 centimeter low-attenuation lesion is identified
within the liver on image 143 of series 4. This lesion cannot be
further characterized given the contrast bolus timing. Adrenal
glands and remainder of the UPPER abdomen are unremarkable.

Musculoskeletal: No chest wall abnormality. No acute or significant
osseous findings.

Review of the MIP images confirms the above findings.
IMPRESSION: 1. Technically adequate exam showing no acute pulmonary embolus.
2. Part solid mass in the LEFT UPPER lobe measuring up to
centimeters callus solid component 6 millimeters. Follow-up
non-contrast CT recommended at 3-6 months to confirm persistence. If
unchanged, and solid component remains <6 mm, annual CT is
recommended until 5 years of stability has been established. If
persistent these nodules should be considered highly suspicious if
the solid component of the nodule is 6 mm or greater in size and
enlarging. This recommendation follows the consensus statement:
Guidelines for Management of Incidental Pulmonary Nodules Detected
[DATE].
3. Faint ground-glass opacities in the lingula and LEFT LOWER lobe
could represent infectious process or hemorrhage. Attention to this
area is recommended on follow-up studies.
4. Indeterminate low-attenuation lesion within the liver. Recommend
further evaluation with nonemergent MRI of the abdomen with and
without contrast.

## 2023-11-23 ENCOUNTER — Emergency Department (HOSPITAL_COMMUNITY)

## 2023-11-23 ENCOUNTER — Emergency Department (HOSPITAL_COMMUNITY)
Admission: EM | Admit: 2023-11-23 | Discharge: 2023-11-23 | Disposition: A | Discharge status: Against Medical Advice | Attending: Emergency Medicine | Admitting: Emergency Medicine

## 2023-11-23 ENCOUNTER — Encounter (HOSPITAL_COMMUNITY): Payer: Self-pay

## 2023-11-23 ENCOUNTER — Other Ambulatory Visit: Payer: Self-pay

## 2023-11-23 DIAGNOSIS — Z8551 Personal history of malignant neoplasm of bladder: Secondary | ICD-10-CM | POA: Insufficient documentation

## 2023-11-23 DIAGNOSIS — R55 Syncope and collapse: Secondary | ICD-10-CM | POA: Diagnosis present

## 2023-11-23 LAB — URINALYSIS, ROUTINE W REFLEX MICROSCOPIC
Bacteria, UA: NONE SEEN
Bilirubin Urine: NEGATIVE
Glucose, UA: NEGATIVE mg/dL
Hgb urine dipstick: NEGATIVE
Ketones, ur: NEGATIVE mg/dL
Leukocytes,Ua: NEGATIVE
Nitrite: NEGATIVE
Protein, ur: 30 mg/dL — AB
Specific Gravity, Urine: 1.019 (ref 1.005–1.030)
pH: 5 (ref 5.0–8.0)

## 2023-11-23 LAB — BASIC METABOLIC PANEL
Anion gap: 12 (ref 5–15)
BUN: 17 mg/dL (ref 6–20)
CO2: 24 mmol/L (ref 22–32)
Calcium: 9.8 mg/dL (ref 8.9–10.3)
Chloride: 103 mmol/L (ref 98–111)
Creatinine, Ser: 1.09 mg/dL (ref 0.61–1.24)
GFR, Estimated: >60 mL/min (ref >60)
Glucose, Bld: 96 mg/dL (ref 70–99)
Potassium: 4 mmol/L (ref 3.5–5.1)
Sodium: 139 mmol/L (ref 135–145)

## 2023-11-23 LAB — CBC
HCT: 44.5 % (ref 39.0–52.0)
Hemoglobin: 14.2 g/dL (ref 13.0–17.0)
MCH: 31.4 pg (ref 26.0–34.0)
MCHC: 31.9 g/dL (ref 30.0–36.0)
MCV: 98.5 fL (ref 80.0–100.0)
Platelets: 367 10*3/uL (ref 150–400)
RBC: 4.52 MIL/uL (ref 4.22–5.81)
RDW: 13.6 % (ref 11.5–15.5)
WBC: 6.1 10*3/uL (ref 4.0–10.5)
nRBC: 0 % (ref 0.0–0.2)

## 2023-11-23 LAB — TROPONIN I (HIGH SENSITIVITY): Troponin I (High Sensitivity): 4 ng/L (ref <18)

## 2023-11-23 LAB — CBG MONITORING, ED: Glucose-Capillary: 93 mg/dL (ref 70–99)

## 2023-11-23 NOTE — Discharge Instructions (Signed)
   Follow up with urologist

## 2023-11-23 NOTE — ED Notes (Signed)
Lab will add on Troponin

## 2023-11-23 NOTE — ED Provider Notes (Signed)
 [place] EMERGENCY DEPARTMENT AT [facility] Provider Note   CSN: [id number] Arrival date & time: 11/23/23  1517     History  Chief Complaint  Patient presents with   Near Syncope    Dustin Ruiz is a 58 y.o. male.  Patient here with dizziness near syncope symptoms the last few days.  Nothing makes it worse or better.  History of bladder cancer had surgery but did not complete treatment.  Is not having any urinary retention hematuria abdominal pain.  He is feeling better now.  No chest pain shortness of breath weakness numbness tingling.  Denies any fever or chills.  The history is provided by the patient.       Home Medications Prior to Admission medications   Medication Sig Start Date End Date Taking? Authorizing Provider  ALPRAZolam Prudy Feeler) 1 MG tablet Take 0.5-1 mg by mouth 3 (three) times daily as needed for anxiety. 11/23/18   [provider]  amLODipine (NORVASC) 5 MG tablet Take 5 mg by mouth daily. 05/16/20   [provider]  buPROPion (WELLBUTRIN XL) 150 MG 24 hr tablet Take 150 mg by mouth daily. To help stop smoking    [provider]  butalbital-acetaminophen-caffeine (FIORICET) 50-325-40 MG tablet Take 2 tablets by mouth every 4 (four) hours as needed for headache.    [provider]  eletriptan (RELPAX) 40 MG tablet Take 40 mg by mouth every 2 (two) hours as needed for migraine or headache. May repeat in 2 hours if headache persists or recurs.    [provider]  phenazopyridine (PYRIDIUM) 100 MG tablet Take 100 mg by mouth 3 (three) times daily as needed. 03/25/21   [provider]  phenazopyridine (PYRIDIUM) 200 MG tablet Take 1 tablet (200 mg total) by mouth 3 (three) times daily as needed for pain. 06/09/21   Noel Christmas, MD  tamsulosin (FLOMAX) 0.4 MG CAPS capsule Take 1 capsule (0.4 mg total) by mouth daily. Patient taking differently: Take 0.4 mg by mouth daily after supper. 04/28/21    Noel Christmas, MD  tamsulosin (FLOMAX) 0.4 MG CAPS capsule Take 1 capsule (0.4 mg total) by mouth at bedtime. 06/09/21   Noel Christmas, MD      Allergies    Patient has no known allergies.    Review of Systems   Review of Systems  Physical Exam Updated Vital Signs BP (!) 138/96   Pulse 90   Temp 98.2 F (36.8 C) (Oral)   Resp 18   Ht 5\' 8"  (1.727 m)   Wt 74.8 kg   SpO2 97%   BMI 25.09 kg/m  Physical Exam Vitals and nursing note reviewed.  Constitutional:      General: He is not in acute distress.    Appearance: He is well-developed. He is not ill-appearing.  HENT:     Head: Normocephalic and atraumatic.     Nose: Nose normal.     Mouth/Throat:     Mouth: Mucous membranes are moist.  Eyes:     Extraocular Movements: Extraocular movements intact.     Conjunctiva/sclera: Conjunctivae normal.     Pupils: Pupils are equal, round, and reactive to light.  Cardiovascular:     Rate and Rhythm: Normal rate and regular rhythm.     Pulses: Normal pulses.     Heart sounds: Normal heart sounds. No murmur heard. Pulmonary:     Effort: Pulmonary effort is normal. No respiratory distress.     Breath  sounds: Normal breath sounds.  Abdominal:     General: Abdomen is flat.     Palpations: Abdomen is soft.     Tenderness: There is no abdominal tenderness.  Musculoskeletal:        General: No swelling.     Cervical back: Normal range of motion and neck supple.  Skin:    General: Skin is warm and dry.     Capillary Refill: Capillary refill takes less than 2 seconds.  Neurological:     General: No focal deficit present.     Mental Status: He is alert and oriented to person, place, and time.     Cranial Nerves: No cranial nerve deficit.     Sensory: No sensory deficit.     Motor: No weakness.     Coordination: Coordination normal.     Comments: 5+ out of 5 strength throughout, normal gait, normal finger-nose-finger, normal visual fields, normal speech  Psychiatric:         Mood and Affect: Mood normal.     ED Results / Procedures / Treatments   Labs (all labs ordered are listed, but only abnormal results are displayed) Labs Reviewed  URINALYSIS, ROUTINE W REFLEX MICROSCOPIC - Abnormal; Notable for the following components:      Result Value   Protein, ur 30 (*)    All other components within normal limits  BASIC METABOLIC PANEL  CBC  CBG MONITORING, ED  TROPONIN I (HIGH SENSITIVITY)  TROPONIN I (HIGH SENSITIVITY)    EKG EKG Interpretation Date/Time:  Wednesday November 23 2023 15:51:28 EDT Ventricular Rate:  96 PR Interval:  130 QRS Duration:  82 QT Interval:  344 QTC Calculation: 435 R Axis:   79  Text Interpretation: Sinus rhythm Confirmed by Virgina Norfolk [phone]) on 11/23/2023 6:16:13 PM  Radiology CT Head Wo Contrast Result Date: 11/23/2023 CLINICAL DATA:  Syncope/presyncope with cerebrovascular cause suspected EXAM: CT HEAD WITHOUT CONTRAST TECHNIQUE: Contiguous axial images were obtained from the base of the skull through the vertex without intravenous contrast. RADIATION DOSE REDUCTION: This exam was performed according to the departmental dose-optimization program which includes automated exposure control, adjustment of the mA and/or kV according to patient size and/or use of iterative reconstruction technique. COMPARISON:  None Available. FINDINGS: Brain: No evidence of acute infarction, hemorrhage, hydrocephalus, extra-axial collection or mass lesion/mass effect. Vascular: No hyperdense vessel or unexpected calcification. Skull: Normal. Negative for fracture or focal lesion. Sinuses/Orbits: No acute finding. IMPRESSION: No acute finding. Electronically Signed   By: Tiburcio Pea M.D.   On: 11/23/2023 18:57    Procedures Procedures    Medications Ordered in ED Medications - No data to display  ED Course/ Medical Decision Making/ A&P                                 Medical Decision Making Amount and/or Complexity of Data  Reviewed Labs: ordered.   Dustin Ruiz is here with dizziness near syncope symptoms.  Normal vitals.  No fever.  History of bladder cancer had surgery to remove this but sounds like he did not complete treatment.  Is not having any abdominal pain hematuria.  When he had bladder cancer he started to have some near syncope symptoms but then he developed hematuria but he has not had any of that now.  He is neurologically intact.  He is not having any headache neck pain fever or chills.  Overall EKG shows  sinus rhythm.  No ischemic changes per my review and interpretation.  He has not had episode of loss of consciousness.  Will check for electrolyte abnormality dehydration cardiac process.  Will get CBC BMP urinalysis head CT troponin.  He is not having any abdominal pain or hematuria no retention.  I do not think we need to image his belly at this time but I have strongly encouraged him to follow-up with his urologist to figure out next steps in treatment and maybe get reinitiated as it does not sound like he ever fully completed his course of treatment.  Per my review interpretation of labs no significant anemia electrolyte abnormality kidney injury or leukocytosis.  Troponin normal.  Head CT is unremarkable.  Patient eloped prior to me going over his results.  Although I think he was safe for discharge.  We had already talked about him following up with urology.  I do not have any concern that he was having stroke or other emergent process.  This chart was dictated using voice recognition software.  Despite best efforts to proofread,  errors can occur which can change the documentation meaning.         Final Clinical Impression(s) / ED Diagnoses Final diagnoses:  Near syncope    Rx / DC Orders ED Discharge Orders     None         Virgina Norfolk, DO 11/23/23 1908

## 2023-11-23 NOTE — ED Triage Notes (Signed)
 Intermittent dizziness for a few days. Pt states he almost passed out today. Right shoulder pain that radiates into neck for 3 weeks.

## 2023-11-23 NOTE — ED Notes (Signed)
 PT stated he is leaving. PT seen walking towards triage

## 2023-11-23 NOTE — ED Provider Triage Note (Signed)
 Emergency Medicine Provider Triage Evaluation Note  Dustin Ruiz , a 58 y.o. male  was evaluated in triage.  Pt complains of dizziness/lightheadedness for the past week. He reports that is is worse with head movements and position changes. He reports that today he felt near syncope at worked and decided to come into the ER. He reports that has similar feelings when he had a tumor in his bladder. He reports taht he did have urinary symptoms with this ans has not had that this time. Denies any chest pain or SOB. He was sent over from PCP for this. He did have bladder cancer. He did one treatment and surgery to remove it. He has not followed with oncology, continue his chemo, or return for follow up visit.  Review of Systems  Positive:  Negative:   Physical Exam  BP (!) 138/96   Pulse 90   Temp 98.2 F (36.8 C) (Oral)   Resp 18   Ht 5\' 8"  (1.727 m)   Wt 74.8 kg   SpO2 97%   BMI 25.09 kg/m  Gen:   Awake, no distress   Resp:  Normal effort  MSK:   Moves extremities without difficulty  Other:  Ambulatory, no facial droop. Answering questions appropriately with appropriate speech.  Medical Decision Making  Medically screening exam initiated at 4:47 PM.  Appropriate orders placed.  MAXIME BECKNER was informed that the remainder of the evaluation will be completed by another provider, this initial triage assessment does not replace that evaluation, and the importance of remaining in the ED until their evaluation is complete.  CT and labs ordered   Achille Rich, [place] 11/23/23 1655
